# Patient Record
Sex: Male | Born: 1980 | Race: White | Hispanic: No | Marital: Single | Smoking: Never smoker
Health system: Southern US, Community
[De-identification: ages and names within clinical notes are randomized; demographics above are authoritative.]

## PROBLEM LIST (undated history)

## (undated) DIAGNOSIS — M109 Gout, unspecified: Secondary | ICD-10-CM

## (undated) DIAGNOSIS — E119 Type 2 diabetes mellitus without complications: Secondary | ICD-10-CM

## (undated) DIAGNOSIS — M1990 Unspecified osteoarthritis, unspecified site: Secondary | ICD-10-CM

## (undated) DIAGNOSIS — I10 Essential (primary) hypertension: Secondary | ICD-10-CM

## (undated) HISTORY — DX: Type 2 diabetes mellitus without complications: E11.9

## (undated) HISTORY — PX: EAR TUBE REMOVAL: SHX1486

## (undated) HISTORY — DX: Gout, unspecified: M10.9

## (undated) HISTORY — PX: WISDOM TOOTH EXTRACTION: SHX21

---

## 2005-07-08 ENCOUNTER — Emergency Department (HOSPITAL_COMMUNITY): Admission: EM | Admit: 2005-07-08 | Discharge: 2005-07-08 | Payer: Self-pay | Admitting: Emergency Medicine

## 2005-07-15 ENCOUNTER — Emergency Department (HOSPITAL_COMMUNITY): Admission: EM | Admit: 2005-07-15 | Discharge: 2005-07-15 | Payer: Self-pay | Admitting: Emergency Medicine

## 2016-04-08 DIAGNOSIS — Z87442 Personal history of urinary calculi: Secondary | ICD-10-CM

## 2016-04-08 HISTORY — DX: Personal history of urinary calculi: Z87.442

## 2016-11-25 ENCOUNTER — Ambulatory Visit (INDEPENDENT_AMBULATORY_CARE_PROVIDER_SITE_OTHER)

## 2016-11-25 ENCOUNTER — Encounter: Payer: Self-pay | Admitting: Family Medicine

## 2016-11-25 ENCOUNTER — Ambulatory Visit (INDEPENDENT_AMBULATORY_CARE_PROVIDER_SITE_OTHER): Admitting: Family Medicine

## 2016-11-25 VITALS — BP 142/82 | HR 64 | Temp 98.7°F | Resp 16 | Ht 69.0 in | Wt 300.0 lb

## 2016-11-25 DIAGNOSIS — M25572 Pain in left ankle and joints of left foot: Secondary | ICD-10-CM | POA: Diagnosis not present

## 2016-11-25 DIAGNOSIS — IMO0001 Reserved for inherently not codable concepts without codable children: Secondary | ICD-10-CM

## 2016-11-25 DIAGNOSIS — S93409A Sprain of unspecified ligament of unspecified ankle, initial encounter: Secondary | ICD-10-CM

## 2016-11-25 NOTE — Progress Notes (Addendum)
Subjective:  By signing my name below, I, Wayne Castillo, attest that this documentation has been prepared under the direction and in the presence of Shade Flood, MD Electronically Signed: Charline Bills, ED Scribe 11/25/2016 at 5:35 PM.   Patient ID: Wayne Castillo, male    DOB: [birth year], 36 y.o.   MRN: [id number]  Chief Complaint  Patient presents with  . Ankle Pain    twisted left ankle today when getting off truck   HPI JUANANTONIO STOLAR is a 36 y.o. male who presents to Primary Care at [facility] complaining of an injury that occurred at work today. 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 Pt reports feeling a pop in his left ankle during the incident just prior to noticing swelling. States that he may have twisted both ankles previously but no difficulty using his ankle prior to injury. Pt was able to ambulate after the injury but states pain set in 10 minutes following the injury. No treatments tried PTA.   No Known Allergies  No current outpatient prescriptions on file prior to visit.   No current facility-administered medications on file prior to visit.    Review of Systems  Musculoskeletal: Positive for arthralgias and joint swelling.      Objective:   Physical Exam  Constitutional: He is oriented to person, place, and time. He appears well-developed and well-nourished. No distress.  HENT:  Head: Normocephalic and atraumatic.  Eyes: Conjunctivae and EOM are normal.  Neck: Neck supple. No tracheal deviation present.  Cardiovascular: Normal rate.   Pulses:      Dorsalis pedis pulses are 2+ on the right side, and 2+ on the left side.  Pulmonary/Chest: Effort normal. No respiratory distress.  Musculoskeletal: Normal range of motion.  L knee: pain free ROM. No fibular head tenderness.  L ankle: medial malleolus non-tender posteriorly. Soft tissue swelling over lateral  ankle. Some tenderness over lateral malleolus. Navicular non-tender. Some guarding with inversion and eversion. Cap refill less than 2 seconds of all toes. NVI distally. Achilles is non-tender.  Neurological: He is alert and oriented to person, place, and time.  Skin: Skin is warm and dry.  Psychiatric: He has a normal mood and affect. His behavior is normal.  Nursing note and vitals reviewed.  Vitals:   11/25/16 1609 11/25/16 1649  BP: (!) 145/83 (!) 142/82  Pulse: 64   Resp: 16   Temp: 98.7 F (37.1 C)   TempSrc: Oral   SpO2: 96%   Weight: 300 lb (136.1 kg)   Height: 5\' 9"  (1.753 m)    Dg Ankle Complete Left  Result Date: 11/25/2016 CLINICAL DATA:  Left ankle pain/swelling EXAM: LEFT ANKLE COMPLETE - 3+ VIEW COMPARISON:  None. FINDINGS: No fracture or dislocation is seen. The ankle mortise is intact. The base of the fifth metatarsal is unremarkable. Mild lateral soft tissue swelling. IMPRESSION: No fracture or dislocation is seen. Mild lateral soft tissue swelling. Electronically Signed   By: Charline Bills M.D.   On: 11/25/2016 18:12        Assessment & Plan:   Wayne Castillo is a 36 y.o. male Acute left ankle pain - Plan: DG Ankle Complete Left, Apply ASO ankle First degree ankle sprain, unspecified laterality, initial encounter - Plan: Apply ASO ankle  - Left lateral ankle sprain, no sign of fracture on x-ray.  Minimal laxity with testing, suspect grade 1 or grade 2 at the most.  - ASO brace applied, handout given on ankle sprain, ankle alphabet and range of motion discussed, ibuprofen over-the-counter 600 mg every 6 hours as needed, and recheck in 1 week. Restrictions were provided for work including on his duty status report.   - Had difficulty with weightbearing with just ASO brace, placed in crutches, then weight-bear as tolerated. Paperwork addended.   Meds ordered this encounter  Medications  . allopurinol (ZYLOPRIM) 300 MG tablet    Sig: Take 300 mg by mouth daily.    . metFORMIN (GLUCOPHAGE) 500 MG tablet    Sig: Take 500 mg by mouth 2 (two) times daily with a meal.   Patient Instructions   Ibuprofen up to 600mg  every 6 hours as needed with food. Ice and elevate as discussed, range of motion few times per day or ankle alphabet as tolerated, and recheck in 1 week.   Return to the clinic or go to the nearest emergency room if any of your symptoms worsen or new symptoms occur.    Ankle Sprain An ankle sprain is a stretch or tear in one of the tough, fiber-like tissues (ligaments) in the ankle. The ligaments in your ankle help to hold the bones of the ankle together. What are the causes? This condition is often caused by stepping on or falling on the outer edge of the foot. What increases the risk? This condition is more likely to develop in people who play sports. What are the signs or symptoms? Symptoms of this condition include:  Pain in your ankle.  Swelling.  Bruising. Bruising may develop right after you sprain your ankle or 1-2 days later.  Trouble standing or walking, especially when you turn or change directions.  How is this diagnosed? This condition is diagnosed with a physical exam. During the exam, your health care provider will press on certain parts of your foot and ankle and try to move them in certain ways. X-rays may be taken to see how severe the sprain is and to check for broken bones. How is this treated? This condition may be treated with:  A brace. This is used to keep the ankle from moving until it heals.  An elastic bandage. This is used to support the ankle.  Crutches.  Pain medicine.  Surgery. This may be needed if the sprain is severe.  Physical therapy. This may help to improve the range of motion in the ankle.  Follow these instructions at home:  Rest your ankle.  Take over-the-counter and prescription medicines only as told by your health care provider.  For 2-3 days, keep your ankle raised  (elevated) above the level of your heart as much as possible.  If directed, apply ice to the area: ? Put ice in a plastic bag. ? Place a towel between your skin and the bag. ? Leave the ice on for 20 minutes, 2-3 times a day.  If you were given a brace: ? Wear it as directed. ? Remove it to shower or bathe. ? Try not to move your ankle much, but wiggle your toes from time to time. This helps to prevent swelling.  If you were given an elastic bandage (dressing): ? Remove it to shower or bathe. ? Try not to move your ankle much, but wiggle your toes from time to time. This helps to prevent swelling. ? Adjust the dressing to make it more comfortable if it feels too  tight. ? Loosen the dressing if you have numbness or tingling in your foot, or if your foot becomes cold and blue.  If you have crutches, use them as told by your health care provider. Continue to use them until you can walk without feeling pain in your ankle. Contact a health care provider if:  You have rapidly increasing bruising or swelling.  Your pain is not relieved with medicine. Get help right away if:  Your toes or foot becomes numb or blue.  You have severe pain that gets worse. This information is not intended to replace advice given to you by your health care provider. Make sure you discuss any questions you have with your health care provider. Document Released: 03/25/2005 Document Revised: 08/02/2015 Document Reviewed: 10/25/2014 Elsevier Interactive Patient Education  2017 [employer].    IF you received an x-ray today, you will receive an invoice from [facility]iology. Please contact [facility] at [phone] with questions or concerns regarding your invoice.   IF you received labwork today, you will receive an invoice from [place]. Please contact [facility] at [phone] with questions or concerns regarding your invoice.   Our billing staff will not be able to assist you with questions  regarding bills from these companies.  You will be contacted with the lab results as soon as they are available. The fastest way to get your results is to activate your My Chart account. Instructions are located on the last page of this paperwork. If you have not heard from [place] regarding the results in 2 weeks, please contact this office.       I personally performed the services described in this documentation, which was scribed in my presence. The recorded information has been reviewed and considered for accuracy and completeness, addended by me as needed, and agree with information above.  Signed,   Meredith Staggers, MD Primary Care at [facility].  11/25/16 6:35 PM

## 2016-11-25 NOTE — Patient Instructions (Addendum)
Ibuprofen up to 600mg  every 6 hours as needed with food. Ice and elevate as discussed, range of motion few times per day or ankle alphabet as tolerated, and recheck in 1 week.   Crutches tonight, then weight-bear as tolerated. If you're unable to walk with that brace inside of a shoe, will need to return to work with seated work only and use of crutches.  Return to the clinic or go to the nearest emergency room if any of your symptoms worsen or new symptoms occur.    Ankle Sprain An ankle sprain is a stretch or tear in one of the tough, fiber-like tissues (ligaments) in the ankle. The ligaments in your ankle help to hold the bones of the ankle together. What are the causes? This condition is often caused by stepping on or falling on the outer edge of the foot. What increases the risk? This condition is more likely to develop in people who play sports. What are the signs or symptoms? Symptoms of this condition include:  Pain in your ankle.  Swelling.  Bruising. Bruising may develop right after you sprain your ankle or 1-2 days later.  Trouble standing or walking, especially when you turn or change directions.  How is this diagnosed? This condition is diagnosed with a physical exam. During the exam, your health care provider will press on certain parts of your foot and ankle and try to move them in certain ways. X-rays may be taken to see how severe the sprain is and to check for broken bones. How is this treated? This condition may be treated with:  A brace. This is used to keep the ankle from moving until it heals.  An elastic bandage. This is used to support the ankle.  Crutches.  Pain medicine.  Surgery. This may be needed if the sprain is severe.  Physical therapy. This may help to improve the range of motion in the ankle.  Follow these instructions at home:  Rest your ankle.  Take over-the-counter and prescription medicines only as told by your health care  provider.  For 2-3 days, keep your ankle raised (elevated) above the level of your heart as much as possible.  If directed, apply ice to the area: ? Put ice in a plastic bag. ? Place a towel between your skin and the bag. ? Leave the ice on for 20 minutes, 2-3 times a day.  If you were given a brace: ? Wear it as directed. ? Remove it to shower or bathe. ? Try not to move your ankle much, but wiggle your toes from time to time. This helps to prevent swelling.  If you were given an elastic bandage (dressing): ? Remove it to shower or bathe. ? Try not to move your ankle much, but wiggle your toes from time to time. This helps to prevent swelling. ? Adjust the dressing to make it more comfortable if it feels too tight. ? Loosen the dressing if you have numbness or tingling in your foot, or if your foot becomes cold and blue.  If you have crutches, use them as told by your health care provider. Continue to use them until you can walk without feeling pain in your ankle. Contact a health care provider if:  You have rapidly increasing bruising or swelling.  Your pain is not relieved with medicine. Get help right away if:  Your toes or foot becomes numb or blue.  You have severe pain that gets worse. This information is not  intended to replace advice given to you by your health care provider. Make sure you discuss any questions you have with your health care provider. Document Released: 03/25/2005 Document Revised: 08/02/2015 Document Reviewed: 10/25/2014 Elsevier Interactive Patient Education  2017 [employer].    IF you received an x-ray today, you will receive an invoice from [facility]. Please contact [facility] Radiology at [phone] with questions or concerns regarding your invoice.   IF you received labwork today, you will receive an invoice from [place]. Please contact [facility] at [phone] with questions or concerns regarding your invoice.   Our billing  staff will not be able to assist you with questions regarding bills from these companies.  You will be contacted with the lab results as soon as they are available. The fastest way to get your results is to activate your My Chart account. Instructions are located on the last page of this paperwork. If you have not heard from [place] regarding the results in 2 weeks, please contact this office.

## 2016-11-29 DIAGNOSIS — E119 Type 2 diabetes mellitus without complications: Secondary | ICD-10-CM | POA: Diagnosis not present

## 2016-11-29 DIAGNOSIS — M1A09X0 Idiopathic chronic gout, multiple sites, without tophus (tophi): Secondary | ICD-10-CM | POA: Diagnosis not present

## 2016-11-29 DIAGNOSIS — R030 Elevated blood-pressure reading, without diagnosis of hypertension: Secondary | ICD-10-CM | POA: Diagnosis not present

## 2017-06-17 ENCOUNTER — Encounter: Payer: Self-pay | Admitting: Family Medicine

## 2017-06-17 ENCOUNTER — Ambulatory Visit: Payer: Federal, State, Local not specified - PPO | Admitting: Family Medicine

## 2017-06-17 VITALS — BP 120/68 | HR 83 | Ht 69.0 in | Wt 314.1 lb

## 2017-06-17 DIAGNOSIS — E669 Obesity, unspecified: Secondary | ICD-10-CM | POA: Insufficient documentation

## 2017-06-17 DIAGNOSIS — S29019A Strain of muscle and tendon of unspecified wall of thorax, initial encounter: Secondary | ICD-10-CM | POA: Diagnosis not present

## 2017-06-17 DIAGNOSIS — Z6841 Body Mass Index (BMI) 40.0 and over, adult: Secondary | ICD-10-CM | POA: Diagnosis not present

## 2017-06-17 MED ORDER — METHOCARBAMOL 500 MG PO TABS: 500.0000 mg | ORAL_TABLET | Freq: Four times a day (QID) | ORAL | 1 refills | Status: DC

## 2017-06-17 NOTE — Addendum Note (Signed)
Addended by: Nadene RubinsKREMER, WILLIAM A on: 06/17/2017 11:45 AM   Modules accepted: Orders

## 2017-06-17 NOTE — Patient Instructions (Addendum)
BMI for Adults Body mass index (BMI) is a number that is calculated from a person's weight and height. In most adults, the number is used to find how much of an adult's weight is made up of fat. BMI is not as accurate as a direct measure of body fat. How is BMI calculated? BMI is calculated by dividing weight in kilograms by height in meters squared. It can also be calculated by dividing weight in pounds by height in inches squared, then multiplying the resulting number by 703. Charts are available to help you find your BMI quickly and easily without doing this calculation. How is BMI interpreted? Health care professionals use BMI charts to identify whether an adult is underweight, at a normal weight, or overweight based on the following guidelines:  Underweight: BMI less than 18.5.  Normal weight: BMI between 18.5 and 24.9.  Overweight: BMI between 25 and 29.9.  Obese: BMI of 30 and above.  BMI is usually interpreted the same for males and females. Weight includes both fat and muscle, so someone with a muscular build, such as an athlete, may have a BMI that is higher than 24.9. In cases like these, BMI may not accurately depict body fat. To determine if excess body fat is the cause of a BMI of 25 or higher, further assessments may need to be done by a health care provider. Why is BMI a useful tool? BMI is used to identify a possible weight problem that may be related to a medical problem or may increase the risk for medical problems. BMI can also be used to promote changes to reach a healthy weight. This information is not intended to replace advice given to you by your health care provider. Make sure you discuss any questions you have with your health care provider. Document Released: 12/05/2003 Document Revised: 08/03/2015 Document Reviewed: 08/20/2013 Elsevier Interactive Patient Education  2018 [employer].  Exercising to [employer] Exercising can help you to lose weight. In order to  lose weight through exercise, you need to do vigorous-intensity exercise. You can tell that you are exercising with vigorous intensity if you are breathing very hard and fast and cannot hold a conversation while exercising. Moderate-intensity exercise helps to maintain your current weight. You can tell that you are exercising at a moderate level if you have a higher heart rate and faster breathing, but you are still able to hold a conversation. How often should I exercise? Choose an activity that you enjoy and set realistic goals. Your health care provider can help you to make an activity plan that works for you. Exercise regularly as directed by your health care provider. This may include:  Doing resistance training twice each week, such as: ? Push-ups. ? Sit-ups. ? Lifting weights. ? Using resistance bands.  Doing a given intensity of exercise for a given amount of time. Choose from these options: ? 150 minutes of moderate-intensity exercise every week. ? 75 minutes of vigorous-intensity exercise every week. ? A mix of moderate-intensity and vigorous-intensity exercise every week.  Children, pregnant women, people who are out of shape, people who are overweight, and older adults may need to consult a health care provider for individual recommendations. If you have any sort of medical condition, be sure to consult your health care provider before starting a new exercise program. What are some activities that can help me to lose weight?  Walking at a rate of at least 4.5 miles an hour.  Jogging or running at  a rate of 5 miles per hour.  Biking at a rate of at least 10 miles per hour.  Lap swimming.  Roller-skating or in-line skating.  Cross-country skiing.  Vigorous competitive sports, such as football, basketball, and soccer.  Jumping rope.  Aerobic dancing. How can I be more active in my day-to-day activities?  Use the stairs instead of the elevator.  Take a walk during your  lunch break.  If you drive, park your car farther away from work or school.  If you take public transportation, get off one stop early and walk the rest of the way.  Make all of your phone calls while standing up and walking around.  Get up, stretch, and walk around every 30 minutes throughout the day. What guidelines should I follow while exercising?  Do not exercise so much that you hurt yourself, feel dizzy, or get very short of breath.  Consult your health care provider prior to starting a new exercise program.  Wear comfortable clothes and shoes with good support.  Drink plenty of water while you exercise to prevent dehydration or heat stroke. Body water is lost during exercise and must be replaced.  Work out until you breathe faster and your heart beats faster. This information is not intended to replace advice given to you by your health care provider. Make sure you discuss any questions you have with your health care provider. Document Released: 04/27/2010 Document Revised: 08/31/2015 Document Reviewed: 08/26/2013 Elsevier Interactive Patient Education  2018 [employer].  Muscle Strain A muscle strain is an injury that occurs when a muscle is stretched beyond its normal length. Usually a small number of muscle fibers are torn when this happens. Muscle strain is rated in degrees. First-degree strains have the least amount of muscle fiber tearing and pain. Second-degree and third-degree strains have increasingly more tearing and pain. Usually, recovery from muscle strain takes 1-2 weeks. Complete healing takes 5-6 weeks. What are the causes? Muscle strain happens when a sudden, violent force placed on a muscle stretches it too far. This may occur with lifting, sports, or a fall. What increases the risk? Muscle strain is especially common in athletes. What are the signs or symptoms? At the site of the muscle strain, there may be:  Pain.  Bruising.  Swelling.  Difficulty  using the muscle due to pain or lack of normal function.  How is this diagnosed? Your health care provider will perform a physical exam and ask about your medical history. How is this treated? Often, the best treatment for a muscle strain is resting, icing, and applying cold compresses to the injured area. Follow these instructions at home:  Use the PRICE method of treatment to promote muscle healing during the first 2-3 days after your injury. The PRICE method involves: ? Protecting the muscle from being injured again. ? Restricting your activity and resting the injured body part. ? Icing your injury. To do this, put ice in a plastic bag. Place a towel between your skin and the bag. Then, apply the ice and leave it on from 15-20 minutes each hour. After the third day, switch to moist heat packs. ? Apply compression to the injured area with a splint or elastic bandage. Be careful not to wrap it too tightly. This may interfere with blood circulation or increase swelling. ? Elevate the injured body part above the level of your heart as often as you can.  Only take over-the-counter or prescription medicines for pain, discomfort, or fever  as directed by your health care provider.  Warming up prior to exercise helps to prevent future muscle strains. Contact a health care provider if:  You have increasing pain or swelling in the injured area.  You have numbness, tingling, or a significant loss of strength in the injured area. This information is not intended to replace advice given to you by your health care provider. Make sure you discuss any questions you have with your health care provider. Document Released: 03/25/2005 Document Revised: 08/31/2015 Document Reviewed: 10/22/2012 Elsevier Interactive Patient Education  2017 [employer] Inc.  Obesity, Adult Obesity is the condition of having too much total body fat. Being overweight or obese means that your weight is greater than what is  considered healthy for your body size. Obesity is determined by a measurement called BMI. BMI is an estimate of body fat and is calculated from height and weight. For adults, a BMI of 30 or higher is considered obese. Obesity can eventually lead to other health concerns and major illnesses, including:  Stroke.  Coronary artery disease (CAD).  Type 2 diabetes.  Some types of cancer, including cancers of the colon, breast, uterus, and gallbladder.  Osteoarthritis.  High blood pressure (hypertension).  High cholesterol.  Sleep apnea.  Gallbladder stones.  Infertility problems.  What are the causes? The main cause of obesity is taking in (consuming) more calories than your body uses for energy. Other factors that contribute to this condition may include:  Being born with genes that make you more likely to become obese.  Having a medical condition that causes obesity. These conditions include: ? Hypothyroidism. ? Polycystic ovarian syndrome (PCOS). ? Binge-eating disorder. ? Cushing syndrome.  Taking certain medicines, such as steroids, antidepressants, and seizure medicines.  Not being physically active (sedentary lifestyle).  Living where there are limited places to exercise safely or buy healthy foods.  Not getting enough sleep.  What increases the risk? The following factors may increase your risk of this condition:  Having a family history of obesity.  Being a woman of African-American descent.  Being a man of Hispanic descent.  What are the signs or symptoms? Having excessive body fat is the main symptom of this condition. How is this diagnosed? This condition may be diagnosed based on:  Your symptoms.  Your medical history.  A physical exam. Your health care provider may measure: ? Your BMI. If you are an adult with a BMI between 25 and less than 30, you are considered overweight. If you are an adult with a BMI of 30 or higher, you are considered  obese. ? The distances around your hips and your waist (circumferences). These may be compared to each other to help diagnose your condition. ? Your skinfold thickness. Your health care provider may gently pinch a fold of your skin and measure it.  How is this treated? Treatment for this condition often includes changing your lifestyle. Treatment may include some or all of the following:  Dietary changes. Work with your health care provider and a dietitian to set a weight-loss goal that is healthy and reasonable for you. Dietary changes may include eating: ? Smaller portions. A portion size is the amount of a particular food that is healthy for you to eat at one time. This varies from person to person. ? Low-calorie or low-fat options. ? More whole grains, fruits, and vegetables.  Regular physical activity. This may include aerobic activity (cardio) and strength training.  Medicine to help you lose weight. Your  health care provider may prescribe medicine if you are unable to lose 1 pound a week after 6 weeks of eating more healthily and doing more physical activity.  Surgery. Surgical options may include gastric banding and gastric bypass. Surgery may be done if: ? Other treatments have not helped to improve your condition. ? You have a BMI of 40 or higher. ? You have life-threatening health problems related to obesity.  Follow these instructions at home:  Eating and drinking   Follow recommendations from your health care provider about what you eat and drink. Your health care provider may advise you to: ? Limit fast foods, sweets, and processed snack foods. ? Choose low-fat options, such as low-fat milk instead of whole milk. ? Eat 5 or more servings of fruits or vegetables every day. ? Eat at home more often. This gives you more control over what you eat. ? Choose healthy foods when you eat out. ? Learn what a healthy portion size is. ? Keep low-fat snacks on hand. ? Avoid sugary  drinks, such as soda, fruit juice, iced tea sweetened with sugar, and flavored milk. ? Eat a healthy breakfast.  Drink enough water to keep your urine clear or pale yellow.  Do not go without eating for long periods of time (do not fast) or follow a fad diet. Fasting and fad diets can be unhealthy and even dangerous. Physical Activity  Exercise regularly, as told by your health care provider. Ask your health care provider what types of exercise are safe for you and how often you should exercise.  Warm up and stretch before being active.  Cool down and stretch after being active.  Rest between periods of activity. Lifestyle  Limit the time that you spend in front of your TV, computer, or video game system.  Find ways to reward yourself that do not involve food.  Limit alcohol intake to no more than 1 drink a day for nonpregnant women and 2 drinks a day for men. One drink equals 12 oz of beer, 5 oz of wine, or 1 oz of hard liquor. General instructions  Keep a weight loss journal to keep track of the food you eat and how much you exercise you get.  Take over-the-counter and prescription medicines only as told by your health care provider.  Take vitamins and supplements only as told by your health care provider.  Consider joining a support group. Your health care provider may be able to recommend a support group.  Keep all follow-up visits as told by your health care provider. This is important. Contact a health care provider if:  You are unable to meet your weight loss goal after 6 weeks of dietary and lifestyle changes. This information is not intended to replace advice given to you by your health care provider. Make sure you discuss any questions you have with your health care provider. Document Released: 05/02/2004 Document Revised: 08/28/2015 Document Reviewed: 01/11/2015 Elsevier Interactive Patient Education  2018 [employer].

## 2017-06-17 NOTE — Progress Notes (Addendum)
Subjective:  Patient ID: Wayne Castillo, male    DOB: [birth year]  Age: 37 y.o. MRN: [id number]  CC: back spasms   HPI CASHIS RILL presents for evaluation of a 2-day history of muscle spasms in the middle part of his back.  There has been no injury to speak of.  He has a long-standing history of intermittent back pain associated with muscle spasms.  He works for the [employer] and certainly does some bending and stooping on that job.  He has taken muscle relaxers and over-the-counter ibuprofen for this in the past.  Relief.  He has not gone for physical therapy yet.  He is well-known to me.  He has struggled with obesity number since known him.  He has a past medical history to include diabetes, gout and of course obesity.  He has had some nutrition counseling for his diabetes and obesity.  He is currently going to the gym.  He tells me that he gets 10,000 steps a day with his job and going to the gym.  Outpatient Medications Prior to Visit  Medication Sig Dispense Refill  . allopurinol (ZYLOPRIM) 300 MG tablet Take 300 mg by mouth daily.    . metFORMIN (GLUCOPHAGE) 500 MG tablet Take 500 mg by mouth 2 (two) times daily with a meal.    . olmesartan (BENICAR) 5 MG tablet Take 1 tablet by mouth daily.  3   No facility-administered medications prior to visit.     ROS Review of Systems  Constitutional: Negative.   HENT: Negative.   Eyes: Negative.   Respiratory: Negative.   Cardiovascular: Negative.   Gastrointestinal: Negative.   Endocrine: Negative for polyphagia and polyuria.  Genitourinary: Negative.   Musculoskeletal: Positive for back pain and myalgias. Negative for gait problem and joint swelling.  Skin: Negative.   Allergic/Immunologic: Negative for immunocompromised state.  Neurological: Negative.   Hematological: Does not bruise/bleed easily.  Psychiatric/Behavioral: Negative.     Objective:  BP 120/68 (BP Location: Left Arm, Patient Position: Sitting, Cuff Size: Large)    Pulse 83   Ht 5\' 9"  (1.753 m)   Wt (!) 314 lb 2 oz (142.5 kg)   SpO2 97%   BMI 46.39 kg/m   BP Readings from Last 3 Encounters:  06/17/17 120/68  11/25/16 (!) 142/82    Wt Readings from Last 3 Encounters:  06/17/17 (!) 314 lb 2 oz (142.5 kg)  11/25/16 300 lb (136.1 kg)    Physical Exam  Constitutional: He is oriented to person, place, and time. He appears well-developed and well-nourished. No distress.  HENT:  Head: Normocephalic and atraumatic.  Right Ear: External ear normal.  Left Ear: External ear normal.  Eyes: Conjunctivae are normal. Right eye exhibits no discharge. Left eye exhibits no discharge. No scleral icterus.  Neck: No JVD present. No tracheal deviation present.  Pulmonary/Chest: Effort normal. No stridor.  Musculoskeletal:       Thoracic back: He exhibits tenderness. He exhibits normal range of motion, no bony tenderness and no spasm.       Lumbar back: He exhibits normal range of motion, no tenderness, no bony tenderness and no spasm.  Neurological: He is alert and oriented to person, place, and time. He has normal strength.  Reflex Scores:      Patellar reflexes are 1+ on the right side and 1+ on the left side.      Achilles reflexes are 1+ on the right side and 1+ on the left side. Neg  dural tension signs.    Skin: Skin is warm and dry. He is not diaphoretic. No erythema. No pallor.  Psychiatric: He has a normal mood and affect. His behavior is normal.    No results found for: WBC, HGB, HCT, PLT, GLUCOSE, CHOL, TRIG, HDL, LDLDIRECT, LDLCALC, ALT, AST, NA, K, CL, CREATININE, BUN, CO2, TSH, PSA, INR, GLUF, HGBA1C, MICROALBUR  No results found.  Assessment & Plan:   Hessie Dienerlan was seen today for back spasms.  Diagnoses and all orders for this visit:  Thoracic myofascial strain, initial encounter -     methocarbamol (ROBAXIN) 500 MG tablet; Take 1 tablet (500 mg total) by mouth 4 (four) times daily. As needed  Class 3 severe obesity due to excess  calories with body mass index (BMI) of 45.0 to 49.9 in adult, unspecified whether serious comorbidity present (HCC) -     Amb Ref to Medical Weight Management -     Ambulatory referral to General Surgery   I am having Tandy GawAlan G. Iovino start on methocarbamol. I am also having him maintain his allopurinol, metFORMIN, and olmesartan.  Meds ordered this encounter  Medications  . methocarbamol (ROBAXIN) 500 MG tablet    Sig: Take 1 tablet (500 mg total) by mouth 4 (four) times daily. As needed    Dispense:  60 tablet    Refill:  1   Patient will use Robaxin and ibuprofen for relief.  He was given information on spasms.  May consider sports medicine referral or further intensive therapy for this ongoing issues with him.  Issues are related to his weight and we discussed that.  He will follow-up with me I am referring him to the bariatric surgeon for consultation.  At his convenience or treatment and evaluation of his diabetes gout.  Follow-up: Return in about 3 months (around 09/17/2017), or if symptoms worsen or fail to improve.  Mliss SaxWilliam Alfred Kremer, MD

## 2017-06-20 ENCOUNTER — Encounter: Payer: Self-pay | Admitting: Family Medicine

## 2017-06-24 ENCOUNTER — Other Ambulatory Visit: Payer: Self-pay | Admitting: Family Medicine

## 2017-07-08 ENCOUNTER — Ambulatory Visit (INDEPENDENT_AMBULATORY_CARE_PROVIDER_SITE_OTHER): Payer: Federal, State, Local not specified - PPO | Admitting: Family Medicine

## 2017-07-08 ENCOUNTER — Encounter: Payer: Self-pay | Admitting: Family Medicine

## 2017-07-08 VITALS — BP 124/74 | HR 99 | Ht 69.0 in | Wt 313.4 lb

## 2017-07-08 DIAGNOSIS — Z6841 Body Mass Index (BMI) 40.0 and over, adult: Secondary | ICD-10-CM

## 2017-07-08 DIAGNOSIS — E119 Type 2 diabetes mellitus without complications: Secondary | ICD-10-CM

## 2017-07-08 DIAGNOSIS — Z0001 Encounter for general adult medical examination with abnormal findings: Secondary | ICD-10-CM

## 2017-07-08 DIAGNOSIS — M109 Gout, unspecified: Secondary | ICD-10-CM

## 2017-07-08 LAB — COMPREHENSIVE METABOLIC PANEL
ALBUMIN: 4.2 g/dL (ref 3.5–5.2)
ALT: 34 U/L (ref 0–53)
AST: 28 U/L (ref 0–37)
Alkaline Phosphatase: 75 U/L (ref 39–117)
BILIRUBIN TOTAL: 0.6 mg/dL (ref 0.2–1.2)
BUN: 15 mg/dL (ref 6–23)
CALCIUM: 9.3 mg/dL (ref 8.4–10.5)
CO2: 29 mEq/L (ref 19–32)
CREATININE: 0.79 mg/dL (ref 0.40–1.50)
Chloride: 102 mEq/L (ref 96–112)
GFR: 116.96 mL/min (ref >60.00)
Glucose, Bld: 119 mg/dL — ABNORMAL HIGH (ref 70–99)
Potassium: 4.7 mEq/L (ref 3.5–5.1)
Sodium: 138 mEq/L (ref 135–145)
TOTAL PROTEIN: 7.4 g/dL (ref 6.0–8.3)

## 2017-07-08 LAB — CBC
HCT: 43.8 % (ref 39.0–52.0)
Hemoglobin: 14.6 g/dL (ref 13.0–17.0)
MCHC: 33.4 g/dL (ref 30.0–36.0)
MCV: 89.1 fl (ref 78.0–100.0)
PLATELETS: 244 10*3/uL (ref 150.0–400.0)
RBC: 4.92 Mil/uL (ref 4.22–5.81)
RDW: 13.5 % (ref 11.5–15.5)
WBC: 6.9 10*3/uL (ref 4.0–10.5)

## 2017-07-08 LAB — URINALYSIS, ROUTINE W REFLEX MICROSCOPIC
BILIRUBIN URINE: NEGATIVE
Hgb urine dipstick: NEGATIVE
KETONES UR: NEGATIVE
Leukocytes, UA: NEGATIVE
Nitrite: NEGATIVE
PH: 7 (ref 5.0–8.0)
RBC / HPF: NONE SEEN (ref 0–?)
Specific Gravity, Urine: 1.02 (ref 1.000–1.030)
Total Protein, Urine: NEGATIVE
UROBILINOGEN UA: 0.2 (ref 0.0–1.0)
Urine Glucose: NEGATIVE

## 2017-07-08 LAB — LIPID PANEL
CHOLESTEROL: 151 mg/dL (ref 0–200)
HDL: 44.3 mg/dL (ref >39.00)
LDL Cholesterol: 85 mg/dL (ref 0–99)
NonHDL: 106.28
TRIGLYCERIDES: 107 mg/dL (ref 0.0–149.0)
Total CHOL/HDL Ratio: 3
VLDL: 21.4 mg/dL (ref 0.0–40.0)

## 2017-07-08 LAB — MICROALBUMIN / CREATININE URINE RATIO
Creatinine,U: 96.1 mg/dL
MICROALB UR: 0.9 mg/dL (ref 0.0–1.9)
Microalb Creat Ratio: 1 mg/g (ref 0.0–30.0)

## 2017-07-08 LAB — HEMOGLOBIN A1C: Hgb A1c MFr Bld: 6.3 % (ref 4.6–6.5)

## 2017-07-08 LAB — URIC ACID: Uric Acid, Serum: 5.6 mg/dL (ref 4.0–7.8)

## 2017-07-08 MED ORDER — OLMESARTAN MEDOXOMIL 5 MG PO TABS: 5.0000 mg | ORAL_TABLET | Freq: Every day | ORAL | 1 refills | Status: DC

## 2017-07-08 MED ORDER — ALLOPURINOL 300 MG PO TABS: 300.0000 mg | ORAL_TABLET | Freq: Every day | ORAL | 1 refills | Status: DC

## 2017-07-08 MED ORDER — METFORMIN HCL 500 MG PO TABS: 500.0000 mg | ORAL_TABLET | Freq: Two times a day (BID) | ORAL | 1 refills | Status: DC

## 2017-07-08 NOTE — Progress Notes (Signed)
Subjective:  Patient ID: Wayne Castillo, male    DOB: 1980-11-17  Age: 37 y.o. MRN: [id number]  CC: Annual Exam   HPI Wayne Castillo presents for a physical exam and follow-up of his obesity diabetes and gout.  He is in the early stages of becoming qualified for possible bariatric surgery and plans to continue with that.  There was confusion about his Glucophage prescription and he is only been taking it once a day.  He is taking his allopurinol daily and has had no gouty flares.  He is working out in the gym every morning by walking and lifting weights.  He is able to achieve 13,000 steps daily on his regular mail out.  He decided not to go for nutrition counseling.  We had a discussion about the fact that that would be important if he were to proceed with some type of bariatric surgery.  He admits to being angry with himself with regards to his weight.  He admits to being somewhat sad and lonely.  He denies frank depression or any thoughts of self-harm.  He has been well this winter and has not suffered from springtime allergies  Outpatient Medications Prior to Visit  Medication Sig Dispense Refill  . methocarbamol (ROBAXIN) 500 MG tablet Take 1 tablet (500 mg total) by mouth 4 (four) times daily. As needed 60 tablet 1  . allopurinol (ZYLOPRIM) 300 MG tablet TAKE 1 TABLET BY MOUTH DAILY 90 tablet 0  . metFORMIN (GLUCOPHAGE) 500 MG tablet Take 500 mg by mouth 2 (two) times daily with a meal.    . olmesartan (BENICAR) 5 MG tablet Take 1 tablet by mouth daily.  3   No facility-administered medications prior to visit.     ROS Review of Systems  Constitutional: Negative.  Negative for unexpected weight change.  HENT: Negative.   Eyes: Negative for photophobia and visual disturbance.  Respiratory: Negative.   Cardiovascular: Negative.   Gastrointestinal: Negative.   Endocrine: Negative for polyphagia and polyuria.  Genitourinary: Negative for difficulty urinating and hematuria.    Musculoskeletal: Negative for gait problem and myalgias.  Skin: Negative for pallor and rash.  Allergic/Immunologic: Negative for immunocompromised state.  Neurological: Negative for weakness and headaches.  Hematological: Does not bruise/bleed easily.  Psychiatric/Behavioral: Negative for dysphoric mood, self-injury and suicidal ideas. The patient is not nervous/anxious.     Objective:  BP 124/74 (BP Location: Left Arm, Patient Position: Sitting, Cuff Size: Normal)   Pulse 99   Ht 5\' 9"  (1.753 m)   Wt (!) 313 lb 6 oz (142.1 kg)   SpO2 96%   BMI 46.28 kg/m   BP Readings from Last 3 Encounters:  07/08/17 124/74  06/17/17 120/68  11/25/16 (!) 142/82    Wt Readings from Last 3 Encounters:  07/08/17 (!) 313 lb 6 oz (142.1 kg)  06/17/17 (!) 314 lb 2 oz (142.5 kg)  11/25/16 300 lb (136.1 kg)    Physical Exam  Constitutional: He is oriented to person, place, and time. He appears well-developed and well-nourished. No distress.  HENT:  Head: Normocephalic and atraumatic.  Right Ear: External ear normal.  Left Ear: External ear normal.  Mouth/Throat: Oropharynx is clear and moist. No oropharyngeal exudate.  Eyes: Pupils are equal, round, and reactive to light. Conjunctivae are normal. Right eye exhibits no discharge. Left eye exhibits no discharge. No scleral icterus.  Neck: Neck supple. No JVD present. No tracheal deviation present. No thyromegaly present.  Cardiovascular: Normal rate, regular rhythm  and normal heart sounds.  Pulmonary/Chest: Effort normal and breath sounds normal. No stridor.  Abdominal: Soft. Bowel sounds are normal. He exhibits no distension. There is no tenderness. There is no rebound.  Lymphadenopathy:    He has no cervical adenopathy.  Neurological: He is alert and oriented to person, place, and time.  Skin: Skin is warm and dry. He is not diaphoretic.  Psychiatric: He has a normal mood and affect. His behavior is normal.    No results found for: WBC,  HGB, HCT, PLT, GLUCOSE, CHOL, TRIG, HDL, LDLDIRECT, LDLCALC, ALT, AST, NA, K, CL, CREATININE, BUN, CO2, TSH, PSA, INR, GLUF, HGBA1C, MICROALBUR  No results found.  Assessment & Plan:   Wayne Castillo was seen today for annual exam.  Diagnoses and all orders for this visit:  Class 3 severe obesity due to excess calories with body mass index (BMI) of 45.0 to 49.9 in adult, unspecified whether serious comorbidity present (HCC) -     CBC -     Lipid panel  Controlled type 2 diabetes mellitus without complication, without long-term current use of insulin (HCC) -     CBC -     Comprehensive metabolic panel -     Lipid panel -     Urinalysis, Routine w reflex microscopic -     Hemoglobin A1c -     Microalbumin / creatinine urine ratio -     metFORMIN (GLUCOPHAGE) 500 MG tablet; Take 1 tablet (500 mg total) by mouth 2 (two) times daily with a meal. -     olmesartan (BENICAR) 5 MG tablet; Take 1 tablet (5 mg total) by mouth daily.  Gout, unspecified cause, unspecified chronicity, unspecified site -     CBC -     Uric acid -     allopurinol (ZYLOPRIM) 300 MG tablet; Take 1 tablet (300 mg total) by mouth daily.   I have changed Wayne Castillo allopurinol, metFORMIN, and olmesartan. I am also having him maintain his methocarbamol.  Meds ordered this encounter  Medications  . allopurinol (ZYLOPRIM) 300 MG tablet    Sig: Take 1 tablet (300 mg total) by mouth daily.    Dispense:  90 tablet    Refill:  1  . metFORMIN (GLUCOPHAGE) 500 MG tablet    Sig: Take 1 tablet (500 mg total) by mouth 2 (two) times daily with a meal.    Dispense:  180 tablet    Refill:  1  . olmesartan (BENICAR) 5 MG tablet    Sig: Take 1 tablet (5 mg total) by mouth daily.    Dispense:  90 tablet    Refill:  1   Encouraged him to continue his healthy habits and follow-up with bariatric surgery.  We may need to pursue nutrition counseling again at a later date.  Follow-up: Return in about 6 months (around  01/07/2018).  Mliss Sax, MD

## 2017-07-08 NOTE — Patient Instructions (Signed)
Complementary and Alternative Medical Therapies for Diabetes Complementary and alternative medical therapies are treatments that are different from typical medical treatments (Western treatments). "Complementary" means that the therapy is used with Western treatments. "Alternative" means that the therapy is used instead of Western treatments. Are these therapies safe? Some of these therapies are usually safe. Others may be harmful. Often, there is not enough research to show how safe or effective a therapy is. If you want to try a complementary or alternative therapy, talk with your health care provider to make sure it is safe. What alternative or complementary therapies are used to treat diabetes? Acupuncture Acupuncture is the insertion of needles into certain places on the skin. This is done by a professional. It is often used to relieve long-term (chronic) pain, especially of the bones and joints. It may help you if you have painful nerve damage. Biofeedback Biofeedback helps you to become more aware of your body's response to pain. It also helps you to learn ways of dealing with pain. Biofeedback is about relaxing and reducing stress. An example of a biofeedback technique is guided imagery. This involves creating peaceful images in your mind. Chromium Chromium is a substance that can help improve how insulin works in the body. Chromium is in many foods, including whole grains, nuts, and egg yolks. Chromium may also be taken as a supplement. Taking chromium supplements may help to control diabetes, especially if you have a lack of chromium (deficiency) in your body. However, research has not proven this. If you have kidney problems, you should be careful with chromium supplements. American ginseng American ginseng is an herb that may lower glucose levels. It may also help lower A1C levels. More research is needed before recommendations for ginseng use can be made. Magnesium Magnesium is a mineral  found in many foods, such as whole grains, nuts, and green leafy vegetables. Having low magnesium levels may make controlling blood glucose more difficult for people who have type 2 diabetes. Low magnesium levels may also contribute to certain diabetes complications. Getting more magnesium and eating a high-fiber diet may reduce the risk of developing type 2 diabetes. Vanadium Vanadium is a compound found in small amounts in certain plants and animals. Some studies show that it improves glucose levels in animals with diabetes. In one study, people with diabetes were able to lower their insulin dosage when taking vanadium. More research about side effects and safe dosage levels is needed. Cinnamon Cinnamon may decrease insulin resistance, increase insulin production, and lower blood glucose levels. It may work best when used with diabetes medicines. Fenugreek Fenugreek is an herb whose seeds are often used in cooking. It may help lower blood glucose by decreasing carbohydrate absorption and increasing insulin production. Summary  Talk with your health care provider about complementary or alternative therapy for you. Some therapies may be appropriate for you, but others may cause side effects.  Follow your diabetes care plan as prescribed. This information is not intended to replace advice given to you by your health care provider. Make sure you discuss any questions you have with your health care provider. Document Released: 01/20/2007 Document Revised: 04/10/2016 Document Reviewed: 04/10/2016 Elsevier Interactive Patient Education  2017 [employer].  Exercising to [employer] Exercising can help you to lose weight. In order to lose weight through exercise, you need to do vigorous-intensity exercise. You can tell that you are exercising with vigorous intensity if you are breathing very hard and fast and cannot hold a conversation while  exercising. Moderate-intensity exercise helps to maintain your  current weight. You can tell that you are exercising at a moderate level if you have a higher heart rate and faster breathing, but you are still able to hold a conversation. How often should I exercise? Choose an activity that you enjoy and set realistic goals. Your health care provider can help you to make an activity plan that works for you. Exercise regularly as directed by your health care provider. This may include:  Doing resistance training twice each week, such as: ? Push-ups. ? Sit-ups. ? Lifting weights. ? Using resistance bands.  Doing a given intensity of exercise for a given amount of time. Choose from these options: ? 150 minutes of moderate-intensity exercise every week. ? 75 minutes of vigorous-intensity exercise every week. ? A mix of moderate-intensity and vigorous-intensity exercise every week.  Children, pregnant women, people who are out of shape, people who are overweight, and older adults may need to consult a health care provider for individual recommendations. If you have any sort of medical condition, be sure to consult your health care provider before starting a new exercise program. What are some activities that can help me to lose weight?  Walking at a rate of at least 4.5 miles an hour.  Jogging or running at a rate of 5 miles per hour.  Biking at a rate of at least 10 miles per hour.  Lap swimming.  Roller-skating or in-line skating.  Cross-country skiing.  Vigorous competitive sports, such as football, basketball, and soccer.  Jumping rope.  Aerobic dancing. How can I be more active in my day-to-day activities?  Use the stairs instead of the elevator.  Take a walk during your lunch break.  If you drive, park your car farther away from work or school.  If you take public transportation, get off one stop early and walk the rest of the way.  Make all of your phone calls while standing up and walking around.  Get up, stretch, and walk around  every 30 minutes throughout the day. What guidelines should I follow while exercising?  Do not exercise so much that you hurt yourself, feel dizzy, or get very short of breath.  Consult your health care provider prior to starting a new exercise program.  Wear comfortable clothes and shoes with good support.  Drink plenty of water while you exercise to prevent dehydration or heat stroke. Body water is lost during exercise and must be replaced.  Work out until you breathe faster and your heart beats faster. This information is not intended to replace advice given to you by your health care provider. Make sure you discuss any questions you have with your health care provider. Document Released: 04/27/2010 Document Revised: 08/31/2015 Document Reviewed: 08/26/2013 Elsevier Interactive Patient Education  2018 [employer].  Gout Gout is painful swelling that can occur in some of your joints. Gout is a type of arthritis. This condition is caused by having too much uric acid in your body. Uric acid is a chemical that forms when your body breaks down substances called purines. Purines are important for building body proteins. When your body has too much uric acid, sharp crystals can form and build up inside your joints. This causes pain and swelling. Gout attacks can happen quickly and be very painful (acute gout). Over time, the attacks can affect more joints and become more frequent (chronic gout). Gout can also cause uric acid to build up under your skin and inside  your kidneys. What are the causes? This condition is caused by too much uric acid in your blood. This can occur because:  Your kidneys do not remove enough uric acid from your blood. This is the most common cause.  Your body makes too much uric acid. This can occur with some cancers and cancer treatments. It can also occur if your body is breaking down too many red blood cells (hemolytic anemia).  You eat too many foods that are high  in purines. These foods include organ meats and some seafood. Alcohol, especially beer, is also high in purines.  A gout attack may be triggered by trauma or stress. What increases the risk? This condition is more likely to develop in people who:  Have a family history of gout.  Are male and middle-aged.  Are male and have gone through menopause.  Are obese.  Frequently drink alcohol, especially beer.  Are dehydrated.  Lose weight too quickly.  Have an organ transplant.  Have lead poisoning.  Take certain medicines, including aspirin, cyclosporine, diuretics, levodopa, and niacin.  Have kidney disease or psoriasis.  What are the signs or symptoms? An attack of acute gout happens quickly. It usually occurs in just one joint. The most common place is the big toe. Attacks often start at night. Other joints that may be affected include joints of the feet, ankle, knee, fingers, wrist, or elbow. Symptoms may include:  Severe pain.  Warmth.  Swelling.  Stiffness.  Tenderness. The affected joint may be very painful to touch.  Shiny, red, or purple skin.  Chills and fever.  Chronic gout may cause symptoms more frequently. More joints may be involved. You may also have white or yellow lumps (tophi) on your hands or feet or in other areas near your joints. How is this diagnosed? This condition is diagnosed based on your symptoms, medical history, and physical exam. You may have tests, such as:  Blood tests to measure uric acid levels.  Removal of joint fluid with a needle (aspiration) to look for uric acid crystals.  X-rays to look for joint damage.  How is this treated? Treatment for this condition has two phases: treating an acute attack and preventing future attacks. Acute gout treatment may include medicines to reduce pain and swelling, including:  NSAIDs.  Steroids. These are strong anti-inflammatory medicines that can be taken by mouth (orally) or injected  into a joint.  Colchicine. This medicine relieves pain and swelling when it is taken soon after an attack. It can be given orally or through an IV tube.  Preventive treatment may include:  Daily use of smaller doses of NSAIDs or colchicine.  Use of a medicine that reduces uric acid levels in your blood.  Changes to your diet. You may need to see a specialist about healthy eating (dietitian).  Follow these instructions at home: During a Gout Attack  If directed, apply ice to the affected area: ? Put ice in a plastic bag. ? Place a towel between your skin and the bag. ? Leave the ice on for 20 minutes, 2-3 times a day.  Rest the joint as much as possible. If the affected joint is in your leg, you may be given crutches to use.  Raise (elevate) the affected joint above the level of your heart as often as possible.  Drink enough fluids to keep your urine clear or pale yellow.  Take over-the-counter and prescription medicines only as told by your health care provider.  Do  not drive or operate heavy machinery while taking prescription pain medicine.  Follow instructions from your health care provider about eating or drinking restrictions.  Return to your normal activities as told by your health care provider. Ask your health care provider what activities are safe for you. Avoiding Future Gout Attacks  Follow a low-purine diet as told by your dietitian or health care provider. Avoid foods and drinks that are high in purines, including liver, kidney, anchovies, asparagus, herring, mushrooms, mussels, and beer.  Limit alcohol intake to no more than 1 drink a day for nonpregnant women and 2 drinks a day for men. One drink equals 12 oz of beer, 5 oz of wine, or 1 oz of hard liquor.  Maintain a healthy weight or lose weight if you are overweight. If you want to lose weight, talk with your health care provider. It is important that you do not lose weight too quickly.  Start or maintain an  exercise program as told by your health care provider.  Drink enough fluids to keep your urine clear or pale yellow.  Take over-the-counter and prescription medicines only as told by your health care provider.  Keep all follow-up visits as told by your health care provider. This is important. Contact a health care provider if:  You have another gout attack.  You continue to have symptoms of a gout attack after10 days of treatment.  You have side effects from your medicines.  You have chills or a fever.  You have burning pain when you urinate.  You have pain in your lower back or belly. Get help right away if:  You have severe or uncontrolled pain.  You cannot urinate. This information is not intended to replace advice given to you by your health care provider. Make sure you discuss any questions you have with your health care provider. Document Released: 03/22/2000 Document Revised: 08/31/2015 Document Reviewed: 01/05/2015 Elsevier Interactive Patient Education  [employer]2018 Else[employer] Inc.

## 2017-10-10 ENCOUNTER — Ambulatory Visit: Payer: Federal, State, Local not specified - PPO | Admitting: Family Medicine

## 2017-10-10 ENCOUNTER — Encounter: Payer: Self-pay | Admitting: Family Medicine

## 2017-10-10 ENCOUNTER — Other Ambulatory Visit: Payer: Self-pay | Admitting: Family Medicine

## 2017-10-10 VITALS — BP 120/70 | HR 74 | Temp 98.5°F | Ht 69.0 in | Wt 305.5 lb

## 2017-10-10 DIAGNOSIS — E119 Type 2 diabetes mellitus without complications: Secondary | ICD-10-CM | POA: Diagnosis not present

## 2017-10-10 DIAGNOSIS — S29019D Strain of muscle and tendon of unspecified wall of thorax, subsequent encounter: Secondary | ICD-10-CM

## 2017-10-10 MED ORDER — OLMESARTAN MEDOXOMIL 5 MG PO TABS: 5.0000 mg | ORAL_TABLET | Freq: Every day | ORAL | 1 refills | Status: DC

## 2017-10-10 MED ORDER — TIZANIDINE HCL 4 MG PO CAPS: 4.0000 mg | ORAL_CAPSULE | Freq: Three times a day (TID) | ORAL | 0 refills | Status: DC | PRN

## 2017-10-10 MED ORDER — IBUPROFEN 800 MG PO TABS: ORAL_TABLET | ORAL | 0 refills | Status: DC

## 2017-10-10 NOTE — Patient Instructions (Signed)
Thoracic Strain A thoracic strain, which is sometimes called a mid-back strain, is an injury to the muscles or tendons that attach to the upper part of your back behind your chest. This type of injury occurs when a muscle is overstretched or overloaded. Thoracic strains can range from mild to severe. Mild strains may involve stretching a muscle or tendon without tearing it. These injuries may heal in 1-2 weeks. More severe strains involve tearing of muscle fibers or tendons. These will cause more pain and may take 6-8 weeks to heal. What are the causes? This condition may be caused by:  An injury in which a sudden force is placed on the muscle.  Exercising without properly warming up.  Overuse of the muscle.  Improper form during certain movements.  Other injuries that surround or cause stress on the mid-back, causing a strain on the muscles.  In some cases, the cause may not be known. What increases the risk? This injury is more common in:  Athletes.  People with obesity.  What are the signs or symptoms? The main symptom of this condition is pain, especially with movement. Other symptoms include:  Bruising.  Swelling.  Spasm.  How is this diagnosed? This condition may be diagnosed with a physical exam. X-rays may be taken to check for a fracture. How is this treated? This condition may be treated with:  Resting and icing the injured area.  Physical therapy. This will involve doing stretching and strengthening exercises.  Medicines for pain and inflammation.  Follow these instructions at home:  Rest as needed. Follow instructions from your health care provider about any restrictions on activity.  If directed, apply ice to the injured area: ? Put ice in a plastic bag. ? Place a towel between your skin and the bag. ? Leave the ice on for 20 minutes, 2-3 times per day.  Take over-the-counter and prescription medicines only as told by your health care  provider.  Begin doing exercises as told by your health care provider or physical therapist.  Always warm up properly before physical activity or sports.  Bend your knees before you lift heavy objects.  Keep all follow-up visits as told by your health care provider. This is important. Contact a health care provider if:  Your pain is not helped by medicine.  Your pain, bruising, or swelling is getting worse.  You have a fever. Get help right away if:  You have shortness of breath.  You have chest pain.  You develop numbness or weakness in your legs.  You have involuntary loss of urine (urinary incontinence). This information is not intended to replace advice given to you by your health care provider. Make sure you discuss any questions you have with your health care provider. Document Released: 06/15/2003 Document Revised: 11/25/2015 Document Reviewed: 05/19/2014 Elsevier Interactive Patient Education  2018 Else[employer] Inc.  

## 2017-10-10 NOTE — Progress Notes (Signed)
Subjective:  Patient ID: Wayne Castillo, male    DOB: 1980/06/14  Age: 37 y.o. MRN: [id number]  CC: Back Pain (x 3 weeks, muscle spasms)   HPI Wayne Castillo presents for evaluation of a 3-week history of pain in his upper back has been radiating around to the front of his chest wall on the right just below his breast area.  No specific injury.  He feels as though the muscles in this area have been tightening and loosening on their own.  There is increased discomfort with movement through the chest wall and when he extends his arms outward.  He works in the [employer].  There is been no change in his bowel or bladder habits.  He has not noticed a rash and denies increased recent stress.  Patient has been taking over-the-counter Advil Robaxin.  He does not feel like these are helping much.  He did not fill his olmesartan because he thought that he was done with it.   Outpatient Medications Prior to Visit  Medication Sig Dispense Refill  . allopurinol (ZYLOPRIM) 300 MG tablet Take 1 tablet (300 mg total) by mouth daily. 90 tablet 1  . metFORMIN (GLUCOPHAGE) 500 MG tablet Take 1 tablet (500 mg total) by mouth 2 (two) times daily with a meal. 180 tablet 1  . methocarbamol (ROBAXIN) 500 MG tablet Take 1 tablet (500 mg total) by mouth 4 (four) times daily. As needed 60 tablet 1  . olmesartan (BENICAR) 5 MG tablet Take 1 tablet (5 mg total) by mouth daily. 90 tablet 1   No facility-administered medications prior to visit.     ROS Review of Systems  Constitutional: Negative for chills, fatigue, fever and unexpected weight change.  HENT: Negative.   Eyes: Negative.   Respiratory: Negative for chest tightness and shortness of breath.   Cardiovascular: Negative for chest pain, palpitations and leg swelling.  Gastrointestinal: Negative for abdominal pain, anal bleeding, blood in stool, diarrhea, nausea and vomiting.  Endocrine: Negative for polyphagia and polyuria.  Genitourinary: Negative for  decreased urine volume and frequency.  Musculoskeletal: Positive for back pain and myalgias.  Skin: Negative.   Allergic/Immunologic: Negative for immunocompromised state.  Neurological: Negative for headaches.  Psychiatric/Behavioral: Negative.     Objective:  BP 120/70   Pulse 74   Temp 98.5 F (36.9 C)   Ht 5\' 9"  (1.753 m)   Wt (!) 305 lb 8 oz (138.6 kg)   SpO2 96%   BMI 45.11 kg/m   BP Readings from Last 3 Encounters:  10/10/17 120/70  07/08/17 124/74  06/17/17 120/68    Wt Readings from Last 3 Encounters:  10/10/17 (!) 305 lb 8 oz (138.6 kg)  07/08/17 (!) 313 lb 6 oz (142.1 kg)  06/17/17 (!) 314 lb 2 oz (142.5 kg)    Physical Exam  Constitutional: He is oriented to person, place, and time. He appears well-developed and well-nourished. No distress.  HENT:  Head: Normocephalic and atraumatic.  Right Ear: External ear normal.  Left Ear: External ear normal.  Mouth/Throat: Oropharynx is clear and moist. No oropharyngeal exudate.  Eyes: Pupils are equal, round, and reactive to light. Conjunctivae and EOM are normal. Right eye exhibits no discharge. Left eye exhibits no discharge. No scleral icterus.  Neck: Neck supple. No JVD present. No tracheal deviation present. No thyromegaly present.  Cardiovascular: Normal rate, regular rhythm and normal heart sounds.  Pulmonary/Chest: Effort normal and breath sounds normal.  Abdominal: Soft. Bowel sounds are  normal.  Musculoskeletal:       Thoracic back: He exhibits normal range of motion, no tenderness, no bony tenderness and no spasm.  Lymphadenopathy:    He has no cervical adenopathy.  Neurological: He is alert and oriented to person, place, and time.  Skin: Skin is warm and dry. Capillary refill takes less than 2 seconds. No rash noted. He is not diaphoretic. No pallor.  Psychiatric: He has a normal mood and affect. His behavior is normal.    Lab Results  Component Value Date   WBC 6.9 07/08/2017   HGB 14.6 07/08/2017    HCT 43.8 07/08/2017   PLT 244.0 07/08/2017   GLUCOSE 119 (H) 07/08/2017   CHOL 151 07/08/2017   TRIG 107.0 07/08/2017   HDL 44.30 07/08/2017   LDLCALC 85 07/08/2017   ALT 34 07/08/2017   AST 28 07/08/2017   NA 138 07/08/2017   K 4.7 07/08/2017   CL 102 07/08/2017   CREATININE 0.79 07/08/2017   BUN 15 07/08/2017   CO2 29 07/08/2017   HGBA1C 6.3 07/08/2017   MICROALBUR 0.9 07/08/2017    No results found.  Assessment & Plan:   Wayne Castillo was seen today for back pain.  Diagnoses and all orders for this visit:  Thoracic myofascial strain, subsequent encounter -     ibuprofen (ADVIL,MOTRIN) 800 MG tablet; One every 8 hours with food for 7 days and then as needed. -     tiZANidine (ZANAFLEX) 4 MG capsule; Take 1 capsule (4 mg total) by mouth 3 (three) times daily as needed for muscle spasms.  Controlled type 2 diabetes mellitus without complication, without long-term current use of insulin (HCC) -     olmesartan (BENICAR) 5 MG tablet; Take 1 tablet (5 mg total) by mouth daily.   I am having Wayne Castillo. Wayne Castillo start on ibuprofen and tiZANidine. I am also having him maintain his methocarbamol, allopurinol, metFORMIN, and olmesartan.  Meds ordered this encounter  Medications  . ibuprofen (ADVIL,MOTRIN) 800 MG tablet    Sig: One every 8 hours with food for 7 days and then as needed.    Dispense:  50 tablet    Refill:  0  . tiZANidine (ZANAFLEX) 4 MG capsule    Sig: Take 1 capsule (4 mg total) by mouth 3 (three) times daily as needed for muscle spasms.    Dispense:  30 capsule    Refill:  0  . olmesartan (BENICAR) 5 MG tablet    Sig: Take 1 tablet (5 mg total) by mouth daily.    Dispense:  90 tablet    Refill:  1   Anticipatory guidance was given with regards to treating thoracic back strains.  He will look out for the development of any rash in the affected area.  We will go ahead and try Zanaflex.  Drowsy precautions were given to the patient.  Suggested that he use Zanaflex at  night and continue his Robaxin and the daytime.  Discussed the rationale for using Medicare at low dose to help protect his kidneys with regards to his diabetes.  Follow-up: Return in about 1 week (around 10/17/2017), or if symptoms worsen or fail to improve.  Mliss Sax, MD

## 2017-12-01 ENCOUNTER — Encounter: Payer: Self-pay | Admitting: Family Medicine

## 2017-12-29 ENCOUNTER — Other Ambulatory Visit: Payer: Self-pay | Admitting: Family Medicine

## 2017-12-29 DIAGNOSIS — E119 Type 2 diabetes mellitus without complications: Secondary | ICD-10-CM

## 2017-12-29 NOTE — Telephone Encounter (Signed)
Was given 6 mos of med in April. Should already have a refill. Due for fu in October.

## 2018-01-07 ENCOUNTER — Ambulatory Visit: Payer: Federal, State, Local not specified - PPO | Admitting: Family Medicine

## 2018-01-13 ENCOUNTER — Ambulatory Visit: Payer: Federal, State, Local not specified - PPO | Admitting: Family Medicine

## 2018-01-13 ENCOUNTER — Encounter: Payer: Self-pay | Admitting: Family Medicine

## 2018-01-13 VITALS — BP 126/80 | HR 63 | Temp 97.6°F | Ht 69.0 in | Wt 314.4 lb

## 2018-01-13 DIAGNOSIS — E119 Type 2 diabetes mellitus without complications: Secondary | ICD-10-CM

## 2018-01-13 DIAGNOSIS — Z6841 Body Mass Index (BMI) 40.0 and over, adult: Secondary | ICD-10-CM

## 2018-01-13 DIAGNOSIS — M109 Gout, unspecified: Secondary | ICD-10-CM | POA: Diagnosis not present

## 2018-01-13 DIAGNOSIS — Z0001 Encounter for general adult medical examination with abnormal findings: Secondary | ICD-10-CM

## 2018-01-13 DIAGNOSIS — Z23 Encounter for immunization: Secondary | ICD-10-CM | POA: Diagnosis not present

## 2018-01-13 LAB — MICROALBUMIN / CREATININE URINE RATIO
CREATININE, U: 76.5 mg/dL
Microalb Creat Ratio: 0.9 mg/g (ref 0.0–30.0)

## 2018-01-13 LAB — COMPREHENSIVE METABOLIC PANEL
ALBUMIN: 4.1 g/dL (ref 3.5–5.2)
ALT: 34 U/L (ref 0–53)
AST: 26 U/L (ref 0–37)
Alkaline Phosphatase: 75 U/L (ref 39–117)
BILIRUBIN TOTAL: 0.4 mg/dL (ref 0.2–1.2)
BUN: 14 mg/dL (ref 6–23)
CO2: 29 meq/L (ref 19–32)
CREATININE: 0.81 mg/dL (ref 0.40–1.50)
Calcium: 9.3 mg/dL (ref 8.4–10.5)
Chloride: 103 mEq/L (ref 96–112)
GFR: 113.32 mL/min (ref >60.00)
Glucose, Bld: 129 mg/dL — ABNORMAL HIGH (ref 70–99)
Potassium: 4.3 mEq/L (ref 3.5–5.1)
Sodium: 138 mEq/L (ref 135–145)
Total Protein: 7.1 g/dL (ref 6.0–8.3)

## 2018-01-13 LAB — URINALYSIS, ROUTINE W REFLEX MICROSCOPIC
Bilirubin Urine: NEGATIVE
HGB URINE DIPSTICK: NEGATIVE
Ketones, ur: NEGATIVE
Leukocytes, UA: NEGATIVE
NITRITE: NEGATIVE
RBC / HPF: NONE SEEN (ref 0–?)
SPECIFIC GRAVITY, URINE: 1.02 (ref 1.000–1.030)
TOTAL PROTEIN, URINE-UPE24: NEGATIVE
Urine Glucose: NEGATIVE
Urobilinogen, UA: 0.2 (ref 0.0–1.0)
WBC, UA: NONE SEEN (ref 0–?)
pH: 6 (ref 5.0–8.0)

## 2018-01-13 LAB — CBC
HCT: 42.4 % (ref 39.0–52.0)
Hemoglobin: 14.2 g/dL (ref 13.0–17.0)
MCHC: 33.6 g/dL (ref 30.0–36.0)
MCV: 87.4 fl (ref 78.0–100.0)
Platelets: 242 10*3/uL (ref 150.0–400.0)
RBC: 4.85 Mil/uL (ref 4.22–5.81)
RDW: 13.1 % (ref 11.5–15.5)
WBC: 6.3 10*3/uL (ref 4.0–10.5)

## 2018-01-13 LAB — LIPID PANEL
CHOL/HDL RATIO: 4
CHOLESTEROL: 145 mg/dL (ref 0–200)
HDL: 39.9 mg/dL (ref >39.00)
LDL CALC: 75 mg/dL (ref 0–99)
NONHDL: 105.24
Triglycerides: 153 mg/dL — ABNORMAL HIGH (ref 0.0–149.0)
VLDL: 30.6 mg/dL (ref 0.0–40.0)

## 2018-01-13 LAB — URIC ACID: Uric Acid, Serum: 4.8 mg/dL (ref 4.0–7.8)

## 2018-01-13 LAB — TSH: TSH: 0.61 u[IU]/mL (ref 0.35–4.50)

## 2018-01-13 LAB — HEMOGLOBIN A1C: HEMOGLOBIN A1C: 6.4 % (ref 4.6–6.5)

## 2018-01-13 NOTE — Patient Instructions (Addendum)
Exercising to Lose Weight Exercising can help you to lose weight. In order to lose weight through exercise, you need to do vigorous-intensity exercise. You can tell that you are exercising with vigorous intensity if you are breathing very hard and fast and cannot hold a conversation while exercising. Moderate-intensity exercise helps to maintain your current weight. You can tell that you are exercising at a moderate level if you have a higher heart rate and faster breathing, but you are still able to hold a conversation. How often should I exercise? Choose an activity that you enjoy and set realistic goals. Your health care provider can help you to make an activity plan that works for you. Exercise regularly as directed by your health care provider. This may include:  Doing resistance training twice each week, such as: ? Push-ups. ? Sit-ups. ? Lifting weights. ? Using resistance bands.  Doing a given intensity of exercise for a given amount of time. Choose from these options: ? 150 minutes of moderate-intensity exercise every week. ? 75 minutes of vigorous-intensity exercise every week. ? A mix of moderate-intensity and vigorous-intensity exercise every week.  Children, pregnant women, people who are out of shape, people who are overweight, and older adults may need to consult a health care provider for individual recommendations. If you have any sort of medical condition, be sure to consult your health care provider before starting a new exercise program. What are some activities that can help me to lose weight?  Walking at a rate of at least 4.5 miles an hour.  Jogging or running at a rate of 5 miles per hour.  Biking at a rate of at least 10 miles per hour.  Lap swimming.  Roller-skating or in-line skating.  Cross-country skiing.  Vigorous competitive sports, such as football, basketball, and soccer.  Jumping rope.  Aerobic dancing. How can I be more active in my day-to-day  activities?  Use the stairs instead of the elevator.  Take a walk during your lunch break.  If you drive, park your car farther away from work or school.  If you take public transportation, get off one stop early and walk the rest of the way.  Make all of your phone calls while standing up and walking around.  Get up, stretch, and walk around every 30 minutes throughout the day. What guidelines should I follow while exercising?  Do not exercise so much that you hurt yourself, feel dizzy, or get very short of breath.  Consult your health care provider prior to starting a new exercise program.  Wear comfortable clothes and shoes with good support.  Drink plenty of water while you exercise to prevent dehydration or heat stroke. Body water is lost during exercise and must be replaced.  Work out until you breathe faster and your heart beats faster. This information is not intended to replace advice given to you by your health care provider. Make sure you discuss any questions you have with your health care provider. Document Released: 04/27/2010 Document Revised: 08/31/2015 Document Reviewed: 08/26/2013 Elsevier Interactive Patient Education  2018 [employer].  Preventive Dental Care, Adult Preventive dental care includes seeing a dentist regularly and practicing good dental care (oral hygiene) at home. These actions can help to prevent cavities and other tooth problems, root canal problems, gum disease (gingivitis), and tooth loss. Regular dental exams may also help your health care provider to diagnose other medical problems. Many diseases, including mouth cancers, have early signs that can be found during  a preventive dental care visit. What can I expect during my dental visits? Many adults see their dentist one or two times each year for oral exams and cleanings. Talk with your dentist about the best preventive dental care schedule for you. At your visit, your dentist may ask you  about:  Your overall health and diet.  Any new symptoms, such as: ? Bleeding gums. ? Mouth, tooth, or jaw pain.  Your dentist will do a mouth (oral) exam to check for:  Cavities.  Gingivitis or other problems.  Signs of cancer.  Neck swelling or lumps.  Abnormal jaw movement or pain in the jaw joint.  You may also have:  Dental X-rays.  Your teeth cleaned.  If you have an early problem, like a cavity, your dentist will schedule time for you to get treatment. If you have a tooth root problem, gum disease, or a sign of another disease, your dentist may send you to see another health care provider for care. How can I care for my teeth at home?  Brush with an approved fluoride toothpaste every morning and night. If possible, brush within 10 minutes after every meal.  Floss one time every day.  Periodically check your teeth for white or brown spots after brushing. These may be signs of cavities.  Check your gums for swelling or bleeding. These may be signs of gum disease, such as gingivitis or periodontitis.  Make sure your diet includes plenty of fruits, vegetables, milk and dairy products, whole grains, and proteins. Do not eat a lot of starchy foods or foods with added sugar. Talk with your health care provider if you have questions about following a healthy diet.  Avoid sodas, sugary snacks, and sticky candies.  Do not smoke.  Do not get mouth piercings.  If you have tooth or gum pain, gargle with a salt-water mixture 3-4 times per day or as needed. To make a salt-water mixture, completely dissolve -1 tsp of salt in 1 cup of warm water.  Take over-the counter and prescription medicines only as told by your dentist.  If you have a permanent tooth knocked out: ? Find the tooth. ? Pick it up by the top (crown) with a tissue or gauze. ? Wash the tooth for no more than 10 seconds under cold, running water. ? Try to put the tooth back into the gum socket. ? Put the  tooth in a glass of milk if you cannot get it back in place. ? Go to your dentist right away. Take the tooth with you. When should I seek medical care? Call your dentist if you have:  Gum, tooth, or jaw pain.  Red, swollen, or bleeding gums.  A tooth or teeth that are very sensitive to hot or cold.  Very bad breath.  A problem with a filling, crown, implant, or denture.  A broken or loose tooth.  A growth or sore in your mouth that is not going away.  Where to find more information: American Dental Association: http://fox-wallace.com/ This information is not intended to replace advice given to you by your health care provider. Make sure you discuss any questions you have with your health care provider. Document Released: 12/14/2014 Document Revised: 08/31/2015 Document Reviewed: 09/06/2014 Elsevier Interactive Patient Education  2018 [employer].  Health Maintenance, Male A healthy lifestyle and preventive care is important for your health and wellness. Ask your health care provider about what schedule of regular examinations is right for you. What should I  know about weight and diet? Eat a Healthy Diet  Eat plenty of vegetables, fruits, whole grains, low-fat dairy products, and lean protein.  Do not eat a lot of foods high in solid fats, added sugars, or salt.  Maintain a Healthy Weight Regular exercise can help you achieve or maintain a healthy weight. You should:  Do at least 150 minutes of exercise each week. The exercise should increase your heart rate and make you sweat (moderate-intensity exercise).  Do strength-training exercises at least twice a week.  Watch Your Levels of Cholesterol and Blood Lipids  Have your blood tested for lipids and cholesterol every 5 years starting at 38 years of age. If you are at high risk for heart disease, you should start having your blood tested when you are 38 years old. You may need to have your cholesterol levels checked more often  if: ? Your lipid or cholesterol levels are high. ? You are older than 38 years of age. ? You are at high risk for heart disease.  What should I know about cancer screening? Many types of cancers can be detected early and may often be prevented. Lung Cancer  You should be screened every year for lung cancer if: ? You are a current smoker who has smoked for at least 30 years. ? You are a former smoker who has quit within the past 15 years.  Talk to your health care provider about your screening options, when you should start screening, and how often you should be screened.  Colorectal Cancer  Routine colorectal cancer screening usually begins at 38 years of age and should be repeated every 5-10 years until you are 38 years old. You may need to be screened more often if early forms of precancerous polyps or small growths are found. Your health care provider may recommend screening at an earlier age if you have risk factors for colon cancer.  Your health care provider may recommend using home test kits to check for hidden blood in the stool.  A small camera at the end of a tube can be used to examine your colon (sigmoidoscopy or colonoscopy). This checks for the earliest forms of colorectal cancer.  Prostate and Testicular Cancer  Depending on your age and overall health, your health care provider may do certain tests to screen for prostate and testicular cancer.  Talk to your health care provider about any symptoms or concerns you have about testicular or prostate cancer.  Skin Cancer  Check your skin from head to toe regularly.  Tell your health care provider about any new moles or changes in moles, especially if: ? There is a change in a mole's size, shape, or color. ? You have a mole that is larger than a pencil eraser.  Always use sunscreen. Apply sunscreen liberally and repeat throughout the day.  Protect yourself by wearing long sleeves, pants, a wide-brimmed hat, and  sunglasses when outside.  What should I know about heart disease, diabetes, and high blood pressure?  If you are 8-52 years of age, have your blood pressure checked every 3-5 years. If you are 63 years of age or older, have your blood pressure checked every year. You should have your blood pressure measured twice-once when you are at a hospital or clinic, and once when you are not at a hospital or clinic. Record the average of the two measurements. To check your blood pressure when you are not at a hospital or clinic, you can use: ?  An automated blood pressure machine at a pharmacy. ? A home blood pressure monitor.  Talk to your health care provider about your target blood pressure.  If you are between 29-58 years old, ask your health care provider if you should take aspirin to prevent heart disease.  Have regular diabetes screenings by checking your fasting blood sugar level. ? If you are at a normal weight and have a low risk for diabetes, have this test once every three years after the age of 31. ? If you are overweight and have a high risk for diabetes, consider being tested at a younger age or more often.  A one-time screening for abdominal aortic aneurysm (AAA) by ultrasound is recommended for men aged 65-75 years who are current or former smokers. What should I know about preventing infection? Hepatitis B If you have a higher risk for hepatitis B, you should be screened for this virus. Talk with your health care provider to find out if you are at risk for hepatitis B infection. Hepatitis C Blood testing is recommended for:  Everyone born from [birth year] through 1966.  Anyone with known risk factors for hepatitis C.  Sexually Transmitted Diseases (STDs)  You should be screened each year for STDs including gonorrhea and chlamydia if: ? You are sexually active and are younger than 38 years of age. ? You are older than 38 years of age and your health care provider tells you that you are  at risk for this type of infection. ? Your sexual activity has changed since you were last screened and you are at an increased risk for chlamydia or gonorrhea. Ask your health care provider if you are at risk.  Talk with your health care provider about whether you are at high risk of being infected with HIV. Your health care provider may recommend a prescription medicine to help prevent HIV infection.  What else can I do?  Schedule regular health, dental, and eye exams.  Stay current with your vaccines (immunizations).  Do not use any tobacco products, such as cigarettes, chewing tobacco, and e-cigarettes. If you need help quitting, ask your health care provider.  Limit alcohol intake to no more than 2 drinks per day. One drink equals 12 ounces of beer, 5 ounces of wine, or 1 ounces of hard liquor.  Do not use street drugs.  Do not share needles.  Ask your health care provider for help if you need support or information about quitting drugs.  Tell your health care provider if you often feel depressed.  Tell your health care provider if you have ever been abused or do not feel safe at home. This information is not intended to replace advice given to you by your health care provider. Make sure you discuss any questions you have with your health care provider. Document Released: 09/21/2007 Document Revised: 11/22/2015 Document Reviewed: 12/27/2014 Elsevier Interactive Patient Education  [employer].

## 2018-01-13 NOTE — Progress Notes (Signed)
Subjective:  Patient ID: Wayne Castillo, male    DOB: Mar 08, 1981  Age: 37 y.o. MRN: [id number]  CC: Follow-up   HPI [name] presents for a physical exam and follow-up for his diabetes and obesity.  He continues to work out at [employer] and just playing walk but that does not seem to be helping.  He has attended seminars for lap band.  He has been on interested in having this procedure done until more recently when he realizes that he will need some help to wait.  He is planning follow-up with the bariatric surgeons.  He has not had an ophthalmology will call check.  He has had no dental care since high school.  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  He does have friends that he sees on the weekends.  He does admit fatigue in the afternoon and feels unrelated motivated to exercise on most days after work he is having no issues taking his medicines.  He does avow compliance with his medicines.  Outpatient Medications Prior to Visit  Medication Sig Dispense Refill  . allopurinol (ZYLOPRIM) 300 MG tablet Take 1 tablet (300 mg total) by mouth daily. 90 tablet 1  . metFORMIN (GLUCOPHAGE) 500 MG tablet Take 1 tablet (500 mg total) by mouth 2 (two) times daily with a meal. 180 tablet 1  . olmesartan (BENICAR) 5 MG tablet Take 1 tablet (5 mg total) by mouth daily. 90 tablet 1  . ibuprofen (ADVIL,MOTRIN) 800 MG tablet One every 8 hours with food for 7 days and then as needed. 50 tablet 0  . methocarbamol (ROBAXIN) 500 MG tablet Take 1 tablet (500 mg total) by mouth 4 (four) times daily. As needed 60 tablet 1  . tiZANidine (ZANAFLEX) 4 MG capsule Take 1 capsule (4 mg total) by mouth 3 (three) times daily as needed for muscle spasms. 30 capsule 0   No facility-administered medications prior to visit.     ROS Review of Systems  Constitutional: Positive for fatigue. Negative for chills, fever and unexpected weight change.  HENT: Negative.   Eyes: Negative.   Respiratory: Negative.    Cardiovascular: Negative.   Gastrointestinal: Negative.   Endocrine: Negative for polyphagia and polyuria.  Genitourinary: Negative.   Musculoskeletal: Negative for gait problem and joint swelling.  Skin: Negative for pallor and rash.  Allergic/Immunologic: Negative for immunocompromised state.  Psychiatric/Behavioral: Negative.     Objective:  BP 126/80   Pulse 63   Temp 97.6 F (36.4 C) (Oral)   Ht 5\' 9"  (1.753 m)   Wt (!) 314 lb 6 oz (142.6 kg)   SpO2 97%   BMI 46.43 kg/m   BP Readings from Last 3 Encounters:  01/13/18 126/80  10/10/17 120/70  07/08/17 124/74    Wt Readings from Last 3 Encounters:  01/13/18 (!) 314 lb 6 oz (142.6 kg)  10/10/17 (!) 305 lb 8 oz (138.6 kg)  07/08/17 (!) 313 lb 6 oz (142.1 kg)    Physical Exam  Constitutional: He is oriented to person, place, and time. He appears well-nourished. No distress.  HENT:  Head: Normocephalic and atraumatic.  Right Ear: External ear normal.  Left Ear: External ear normal.  Mouth/Throat: Oropharynx is clear and moist. No oropharyngeal exudate.  Eyes: Pupils are equal, round, and reactive to light. Conjunctivae and EOM are normal. Right eye exhibits no discharge. Left eye exhibits no discharge. No scleral icterus.  Neck: Neck supple. No JVD present. No  tracheal deviation present. No thyromegaly present.  Cardiovascular: Normal rate, regular rhythm and normal heart sounds.  Pulmonary/Chest: Effort normal and breath sounds normal.  Abdominal: Soft. Bowel sounds are normal. He exhibits no distension and no mass. There is no tenderness. There is no rebound and no guarding. Hernia confirmed negative in the right inguinal area and confirmed negative in the left inguinal area.  Genitourinary: Penis normal. Right testis shows no mass, no swelling and no tenderness. Right testis is descended. Left testis shows no mass, no swelling and no tenderness. Left testis is descended. No hypospadias, penile erythema or penile  tenderness. No discharge found.  Musculoskeletal: He exhibits no edema.  Lymphadenopathy: No inguinal adenopathy noted on the right or left side.  Neurological: He is alert and oriented to person, place, and time.  Skin: Skin is warm and dry. He is not diaphoretic.  Psychiatric: He has a normal mood and affect. His behavior is normal.    Lab Results  Component Value Date   WBC 6.9 07/08/2017   HGB 14.6 07/08/2017   HCT 43.8 07/08/2017   PLT 244.0 07/08/2017   GLUCOSE 119 (H) 07/08/2017   CHOL 151 07/08/2017   TRIG 107.0 07/08/2017   HDL 44.30 07/08/2017   LDLCALC 85 07/08/2017   ALT 34 07/08/2017   AST 28 07/08/2017   NA 138 07/08/2017   K 4.7 07/08/2017   CL 102 07/08/2017   CREATININE 0.79 07/08/2017   BUN 15 07/08/2017   CO2 29 07/08/2017   HGBA1C 6.3 07/08/2017   MICROALBUR 0.9 07/08/2017    No results found.  Assessment & Plan:   Eliseo was seen today for follow-up.  Diagnoses and all orders for this visit:  Controlled type 2 diabetes mellitus without complication, without long-term current use of insulin (HCC) -     Comprehensive metabolic panel -     CBC -     Hemoglobin A1c -     Lipid panel -     Urinalysis, Routine w reflex microscopic -     Microalbumin / creatinine urine ratio -     Ambulatory referral to Ophthalmology  Need for influenza vaccination -     Flu Vaccine QUAD 36+ mos IM  Class 3 severe obesity due to excess calories with body mass index (BMI) of 45.0 to 49.9 in adult, unspecified whether serious comorbidity present (HCC) -     TSH  Encounter for health maintenance examination with abnormal findings -     Comprehensive metabolic panel -     CBC -     HIV Antibody (routine testing w rflx) -     Lipid panel -     Urinalysis, Routine w reflex microscopic -     TSH  Gout, unspecified cause, unspecified chronicity, unspecified site -     Uric acid   I have discontinued Tandy Gaw. Willcutt's methocarbamol, ibuprofen, and tiZANidine. I am  also having him maintain his allopurinol, metFORMIN, and olmesartan.  No orders of the defined types were placed in this encounter.  Patient was encouraged to follow-up with bariatric surgeon for a discussion of the LAP-BAND procedure.  He was also encouraged to start dental care.  He was given information for disease prevention and health promotion.  We will continue him on his current medicines for now and plan to follow-up in 6 months unless his labs today dictate otherwise.  Follow-up: Return in about 6 months (around 07/15/2018), or if symptoms worsen or fail to improve.  Chrissie Noa  Krystal Clark, MD

## 2018-01-14 LAB — HIV ANTIBODY (ROUTINE TESTING W REFLEX): HIV: NONREACTIVE

## 2018-01-20 ENCOUNTER — Other Ambulatory Visit: Payer: Self-pay | Admitting: Family Medicine

## 2018-01-20 DIAGNOSIS — E119 Type 2 diabetes mellitus without complications: Secondary | ICD-10-CM

## 2018-02-06 ENCOUNTER — Encounter (INDEPENDENT_AMBULATORY_CARE_PROVIDER_SITE_OTHER): Payer: Self-pay | Admitting: Ophthalmology

## 2018-02-09 NOTE — Progress Notes (Signed)
[facility] - Clinic Note  02/11/2018     CHIEF COMPLAINT Patient presents for Diabetic Eye Exam   HISTORY OF PRESENT ILLNESS: Wayne Castillo is a 37 y.o. male who presents to the clinic today for:   HPI    Diabetic Eye Exam    Vision is stable.  Associated Symptoms Negative for Flashes, Blind Spot, Photophobia, Scalp Tenderness, Fever, Weight Loss, Jaw Claudication, Glare, Pain, Floaters, Fatigue, Shoulder/Hip pain, Trauma, Redness and Distortion.  Diabetes characteristics include Type 2.  This started 2 years ago.  Blood sugar level is controlled.  Last A1C 6.3.  I, the attending physician,  performed the HPI with the patient and updated documentation appropriately.          Comments    Pt presents on the referral of Dr. Nadene Rubins for DM exam, pt is Type 2 diabetic dx 1-2 years ago, pts last A1C was 6.3, he states he hasnt checked his blood sugar in awhile bc its been pretty steady, pt takes metformin       Last edited by Rennis Chris, MD on 02/11/2018  9:16 AM. (History)    pt states Dr. Doreene Burke sent him here for a diabetic exam, he states he does not wear glasses and he is having no problems with his vision, pts last A1C was 6.3, pt takes metformin, pt states he had an eye infection a few years ago that he went to the ED in [place] for, pt states he was given gtts that cleared it up  Referring physician: Mliss Sax, MD [address] [place], [place] [address]  HISTORICAL INFORMATION:   Selected notes from the MEDICAL RECORD NUMBER Referred by Dr. Nadene Rubins for DM exam LEE:  Ocular Hx- PMH-DM (A1C: 6.3, takes metformin)    CURRENT MEDICATIONS: No current outpatient medications on file. (Ophthalmic Drugs)   No current facility-administered medications for this visit.  (Ophthalmic Drugs)   Current Outpatient Medications (Other)  Medication Sig  . allopurinol (ZYLOPRIM) 300 MG tablet Take 1 tablet (300 mg total) by mouth  daily.  . metFORMIN (GLUCOPHAGE) 500 MG tablet TAKE 1 TABLET(500 MG) BY MOUTH TWICE DAILY WITH A MEAL  . olmesartan (BENICAR) 5 MG tablet Take 1 tablet (5 mg total) by mouth daily.   No current facility-administered medications for this visit.  (Other)      REVIEW OF SYSTEMS: ROS    Positive for: Endocrine, Eyes   Negative for: Constitutional, Gastrointestinal, Neurological, Skin, Genitourinary, Musculoskeletal, HENT, Cardiovascular, Respiratory, Psychiatric, Allergic/Imm, Heme/Lymph   Last edited by Posey Boyer, COT on 02/11/2018  9:13 AM. (History)       ALLERGIES No Known Allergies  PAST MEDICAL HISTORY Past Medical History:  Diagnosis Date  . Diabetes mellitus without complication (HCC)   . Gout    Past Surgical History:  Procedure Laterality Date  . EAR TUBE REMOVAL    . WISDOM TOOTH EXTRACTION      FAMILY HISTORY Family History  Problem Relation Age of Onset  . Amblyopia Neg Hx   . Blindness Neg Hx   . Cataracts Neg Hx   . Glaucoma Neg Hx   . Macular degeneration Neg Hx   . Retinal detachment Neg Hx   . Strabismus Neg Hx   . Retinitis pigmentosa Neg Hx     SOCIAL HISTORY Social History   Tobacco Use  . Smoking status: Never Smoker  . Smokeless tobacco: Never Used  Substance Use Topics  .  Alcohol use: No  . Drug use: No         OPHTHALMIC EXAM:  Base Eye Exam    Visual Acuity (Snellen - Linear)      Right Left   Dist [place] 20/50 -2 20/40   Dist ph [place] 20/20 -1 20/25 +1       Tonometry (Tonopen, 9:19 AM)      Right Left   Pressure 18 20       Pupils      Dark Light Shape React APD   Right 5 2 Round Brisk None   Left 5 2 Round Brisk None       Visual Fields (Counting fingers)      Left Right    Full Full       Extraocular Movement      Right Left    Full, Ortho Full, Ortho       Neuro/Psych    Oriented x3:  Yes   Mood/Affect:  Normal       Dilation    Both eyes:  1.0% Mydriacyl, 2.5% Phenylephrine @ 9:19 AM         Slit Lamp and Fundus Exam    Slit Lamp Exam      Right Left   Lids/Lashes Normal Normal   Conjunctiva/Sclera White and quiet White and quiet   Cornea Clear Clear   Anterior Chamber Deep and clear Deep and clear   Iris Round and dilated, No NVI Round and dilated, No NVI   Lens 1+ Nuclear sclerosis, 1+ Cortical cataract, punctate pigmented deposition on central anterior capsule 1+ Nuclear sclerosis, 1+ Cortical cataract,   Vitreous Normal Normal       Fundus Exam      Right Left   Disc Pink and Sharp Pink and Sharp   C/D Ratio 0.2 0.2   Macula Flat, Good foveal reflex, No heme or edema Flat, Good foveal reflex, No heme or edema, single IRH inferior to fovea   Vessels Tortuous Tortuous   Periphery Attached, No heme  Attached, No heme         Refraction    Manifest Refraction      Sphere Cylinder Dist VA   Right -0.50 Sphere 20/20   Left -1.00 Sphere 20/25          IMAGING AND PROCEDURES  Imaging and Procedures for @TODAY @  OCT, Retina - OU - Both Eyes       Right Eye Quality was good. Central Foveal Thickness: 302. Progression has no prior data. Findings include normal foveal contour, no IRF, no SRF.   Left Eye Quality was good. Central Foveal Thickness: 292. Progression has no prior data. Findings include normal foveal contour, no SRF, no IRF.   Notes *Images captured and stored on drive  Diagnosis / Impression:  NFP, No IRF/SRF OU No DME OU   Clinical management:  See below  Abbreviations: NFP - Normal foveal profile. CME - cystoid macular edema. PED - pigment epithelial detachment. IRF - intraretinal fluid. SRF - subretinal fluid. EZ - ellipsoid zone. ERM - epiretinal membrane. ORA - outer retinal atrophy. ORT - outer retinal tubulation. SRHM - subretinal hyper-reflective material                 ASSESSMENT/PLAN:    ICD-10-CM   1. Diabetes mellitus type 2 without retinopathy (HCC) E11.9   2. Essential hypertension I10   3. Hypertensive  retinopathy of both eyes H35.033   4. Retinal edema H35.81  OCT, Retina - OU - Both Eyes  5. 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 Diabetes mellitus, type 2 without retinopathy, OU - The incidence, risk factors for progression, natural history and treatment options for diabetic retinopathy  were discussed with patient.   - The need for close monitoring of blood glucose, blood pressure, and serum lipids, avoiding cigarette or any type of tobacco, and the need for long term follow up was also discussed with patient. - f/u in 1 year, sooner prn  2,3. Hypertensive retinopathy OU - discussed importance of tight BP control - monitor  4. No retinal edema on exam or OCT  5. Nuclear sclerosis OU - The symptoms of cataract, surgical options, and treatments and risks were discussed with patient. - discussed diagnosis and progression - not yet visually significant - monitor for now   Ophthalmic Meds Ordered this visit:  No orders of the defined types were placed in this encounter.      Return in about 1 year (around 02/12/2019) for DM exam.  There are no Patient Instructions on file for this visit.   Explained the diagnoses, plan, and follow up with the patient and they expressed understanding.  Patient expressed understanding of the importance of proper follow up care.   This document serves as a record of services personally performed by Karie Chimera, MD, PhD. It was created on their behalf by Laurian Brim, OA, an ophthalmic assistant. The creation of this record is the provider's dictation and/or activities during the visit.    Electronically signed by: Laurian Brim, OA  11.04.19 2:15 PM    Karie Chimera, M.D., Ph.D. Diseases & Surgery of the Retina and Vitreous [facility]   I have reviewed the above documentation for accuracy and completeness, and I agree with the above. Karie Chimera, M.D., Ph.D. 02/11/18 2:16 PM    Abbreviations: M  myopia (nearsighted); A astigmatism; H hyperopia (farsighted); P presbyopia; Mrx spectacle prescription;  CTL contact lenses; OD right eye; OS left eye; OU both eyes  XT exotropia; ET esotropia; PEK punctate epithelial keratitis; PEE punctate epithelial erosions; DES dry eye syndrome; MGD meibomian gland dysfunction; ATs artificial tears; PFAT's preservative free artificial tears; NSC nuclear sclerotic cataract; PSC posterior subcapsular cataract; ERM epi-retinal membrane; PVD posterior vitreous detachment; RD retinal detachment; DM diabetes mellitus; DR diabetic retinopathy; NPDR non-proliferative diabetic retinopathy; PDR proliferative diabetic retinopathy; CSME clinically significant macular edema; DME diabetic macular edema; dbh dot blot hemorrhages; CWS cotton wool spot; POAG primary open angle glaucoma; C/D cup-to-disc ratio; HVF humphrey visual field; GVF goldmann visual field; OCT optical coherence tomography; IOP intraocular pressure; BRVO Branch retinal vein occlusion; CRVO central retinal vein occlusion; CRAO central retinal artery occlusion; BRAO branch retinal artery occlusion; RT retinal tear; SB scleral buckle; PPV pars plana vitrectomy; VH Vitreous hemorrhage; PRP panretinal laser photocoagulation; IVK intravitreal kenalog; VMT vitreomacular traction; MH Macular hole;  NVD neovascularization of the disc; NVE neovascularization elsewhere; AREDS age related eye disease study; ARMD age related macular degeneration; POAG primary open angle glaucoma; EBMD epithelial/anterior basement membrane dystrophy; ACIOL anterior chamber intraocular lens; IOL intraocular lens; PCIOL posterior chamber intraocular lens; Phaco/IOL phacoemulsification with intraocular lens placement; PRK photorefractive keratectomy; LASIK laser assisted in situ keratomileusis; HTN hypertension; DM diabetes mellitus; COPD chronic obstructive pulmonary disease

## 2018-02-11 ENCOUNTER — Ambulatory Visit (INDEPENDENT_AMBULATORY_CARE_PROVIDER_SITE_OTHER): Payer: Federal, State, Local not specified - PPO | Admitting: Ophthalmology

## 2018-02-11 ENCOUNTER — Encounter (INDEPENDENT_AMBULATORY_CARE_PROVIDER_SITE_OTHER): Payer: Self-pay | Admitting: Ophthalmology

## 2018-02-11 DIAGNOSIS — H35033 Hypertensive retinopathy, bilateral: Secondary | ICD-10-CM

## 2018-02-11 DIAGNOSIS — H3581 Retinal edema: Secondary | ICD-10-CM | POA: Diagnosis not present

## 2018-02-11 DIAGNOSIS — E119 Type 2 diabetes mellitus without complications: Secondary | ICD-10-CM | POA: Diagnosis not present

## 2018-02-11 DIAGNOSIS — I10 Essential (primary) hypertension: Secondary | ICD-10-CM

## 2018-02-11 DIAGNOSIS — H2513 Age-related nuclear cataract, bilateral: Secondary | ICD-10-CM

## 2018-03-06 ENCOUNTER — Other Ambulatory Visit: Payer: Self-pay | Admitting: Family Medicine

## 2018-03-06 DIAGNOSIS — M109 Gout, unspecified: Secondary | ICD-10-CM

## 2018-03-25 ENCOUNTER — Other Ambulatory Visit: Payer: Self-pay | Admitting: Family Medicine

## 2018-03-25 DIAGNOSIS — E119 Type 2 diabetes mellitus without complications: Secondary | ICD-10-CM

## 2018-07-01 ENCOUNTER — Other Ambulatory Visit: Payer: Self-pay | Admitting: Family Medicine

## 2018-07-01 DIAGNOSIS — E119 Type 2 diabetes mellitus without complications: Secondary | ICD-10-CM

## 2018-07-03 DIAGNOSIS — E1169 Type 2 diabetes mellitus with other specified complication: Secondary | ICD-10-CM | POA: Diagnosis not present

## 2018-07-08 ENCOUNTER — Other Ambulatory Visit: Payer: Self-pay | Admitting: Surgery

## 2018-07-08 ENCOUNTER — Other Ambulatory Visit (HOSPITAL_COMMUNITY): Payer: Self-pay | Admitting: Surgery

## 2018-07-16 ENCOUNTER — Ambulatory Visit: Payer: Federal, State, Local not specified - PPO | Admitting: Family Medicine

## 2018-07-29 ENCOUNTER — Telehealth: Payer: Self-pay | Admitting: Family Medicine

## 2018-07-29 NOTE — Telephone Encounter (Signed)
I called and left message on voicemail to call office and schedule appointment for CPE with Dr. Doreene Burke.

## 2018-08-06 DIAGNOSIS — K080 Exfoliation of teeth due to systemic causes: Secondary | ICD-10-CM | POA: Diagnosis not present

## 2018-08-11 ENCOUNTER — Ambulatory Visit: Payer: Federal, State, Local not specified - PPO | Admitting: Dietician

## 2018-08-12 ENCOUNTER — Ambulatory Visit (HOSPITAL_COMMUNITY): Payer: Federal, State, Local not specified - PPO

## 2018-08-25 ENCOUNTER — Other Ambulatory Visit: Payer: Self-pay

## 2018-08-25 ENCOUNTER — Encounter: Payer: Self-pay | Admitting: Dietician

## 2018-08-25 ENCOUNTER — Encounter: Payer: Federal, State, Local not specified - PPO | Attending: Surgery | Admitting: Dietician

## 2018-08-25 VITALS — Ht 69.0 in | Wt 321.0 lb

## 2018-08-25 DIAGNOSIS — E669 Obesity, unspecified: Secondary | ICD-10-CM

## 2018-08-25 NOTE — Progress Notes (Signed)
Bariatric Pre-Op Nutrition Assessment Medical Nutrition Therapy  Appt Start Time: 8:45am  End time: 9:45am  Patient was seen on 08/25/2018 for Pre-Operative Nutrition Assessment. Assessment and letter of approval faxed to [facility] Bariatric Surgery Program coordinator on 08/25/2018.   Planned surgery: Sleeve Gastrectomy  Pt expectation of surgery: weight loss (to not be considered "morbidly obese"), fit into smaller clothing, avoid potential future health issues, less back pain and generally feel better Pt expectation of dietitian: none stated   Anthropometrics  Start weight at [facility]: 321 lbs (date: 08/25/2018) Height: 69 in BMI: 47.4 kg/m2    Clinical  Medical Hx: obesity, T2DM (does not check BG)  Surgeries: N/A  Medications: allopurinol, metformin, olmesartan medoxomil  Allergies: N/A   Psychosocial/Lifestyle Pt works as a Advertising account planner, lives alone. Parents divorced when he was about 89 years old, which is when weight gain began. 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 States he noticed a coworker had lost a lot of weight and found out it was related to bariatric surgery, pt also states his mom has suggested surgery for a while, which both increased his interest in surgery.    24-Hr Dietary Recall First Meal: egg burrito (or biscuit)  Snack: cherry tomatoes (or chips, or apple) + soda  Second Meal: ham & [place] sandwich + cheese stick (or pasta salad, or veggies, or chips) + water  Snack: usually none  Third Meal: chicken noodle soup (or chili) + sweet tea Snack: peanuts (or chips)  Beverages: soda (Coke), water, sweet tea   Food & Nutrition Related Hx Dietary Hx: No particular foods avoided. If eats out, likes [address] and [address]. Typically packs lunch. Likes to snack on a variety of things, such as vegetables, chips, fruit, nuts, or whatever is in the house.  Estimated Daily Fluid Intake: 64+ oz Supplements: none  GI / Other Notable Symptoms: N/A   Physical  Activity  Current average weekly physical activity: walking several miles a day at work, used to go to the gym before COVID-19 pandemic caused gym closures   Estimated Energy Needs Calories: 2200 Carbohydrate: 248g Protein: 138g Fat: 73g  Pre-Op Goals Reviewed with the Patient . Track food and beverage intake (try MyFitness Pal or the Baritastic app) . Make healthy food choices while monitoring portion sizes . Consume 3 meals per day or try to eat every 3-5 hours . Avoid concentrated sugars and fried foods . Keep sugar & fat in the single digits per serving on food labels . Practice CHEWING your food (aim for applesauce consistency) . Practice not drinking 15 minutes before, during, and 30 minutes after each meal and snack . Avoid all carbonated beverages (ex: soda, sparkling beverages)  . Limit caffeinated beverages (ex: coffee, tea, energy drinks) . Avoid all sugar-sweetened beverages (ex: regular soda, sports drinks)  . Avoid alcohol  . Aim for 64-100 ounces of FLUID daily (with at least half of fluid intake being plain water)  . Aim for at least 60-80 grams of PROTEIN daily . Look for a liquid protein source that contains ?15 g protein and ?5 g carbohydrate (ex: shakes, drinks, shots) . Make a list of non-food related activities . Physical activity is an important part of a healthy lifestyle so keep it moving! The goal is to reach 150 minutes of exercise per week, including cardiovascular and weight baring activity.  *Goals that are bolded indicate the pt would like to start working towards these  Handouts Provided Include  .  Bariatric Surgery handouts (Nutrition Visits, Pre-Op Goals, Protein Shakes, Vitamins & Minerals)  Learning Style & Readiness for Change Teaching method utilized: Visual & Auditory  Demonstrated degree of understanding via: Teach Back  Barriers to learning/adherence to lifestyle change: None Identified   RD's Notes for Next Visit . Discuss mindfulness,  chewing, and diet advancement after surgery   Next Steps Supervised Weight Loss (SWL) Visits Needed: 3  Patient is to return to [facility] in 1 month for 1st SWL Visit.  Patient is to call [facility] to enroll in Pre-Op Class (>2 weeks before surgery) and Post-Op Class (2 weeks after surgery) for further nutrition education when surgery date is scheduled.

## 2018-08-25 NOTE — Patient Instructions (Addendum)
Begin working through your Pre-Op Goals discussed today, starting with:  Practice not drinking 15 minutes before, during, and 30 minutes after each meal and snack    See you next month for your first Supervised Visit!

## 2018-08-27 ENCOUNTER — Ambulatory Visit (INDEPENDENT_AMBULATORY_CARE_PROVIDER_SITE_OTHER): Payer: Federal, State, Local not specified - PPO | Admitting: Family Medicine

## 2018-08-27 ENCOUNTER — Encounter: Payer: Self-pay | Admitting: Family Medicine

## 2018-08-27 ENCOUNTER — Other Ambulatory Visit: Payer: Federal, State, Local not specified - PPO

## 2018-08-27 ENCOUNTER — Telehealth: Payer: Self-pay

## 2018-08-27 VITALS — Ht 69.0 in

## 2018-08-27 DIAGNOSIS — R6889 Other general symptoms and signs: Secondary | ICD-10-CM | POA: Diagnosis not present

## 2018-08-27 DIAGNOSIS — B349 Viral infection, unspecified: Secondary | ICD-10-CM

## 2018-08-27 DIAGNOSIS — Z20822 Contact with and (suspected) exposure to covid-19: Secondary | ICD-10-CM

## 2018-08-27 MED ORDER — PROMETHAZINE-DM 6.25-15 MG/5ML PO SYRP: 5.0000 mL | ORAL_SOLUTION | Freq: Four times a day (QID) | ORAL | 0 refills | Status: DC | PRN

## 2018-08-27 NOTE — Addendum Note (Signed)
Addended by: Wilford Corner on: 08/27/2018 03:03 PM   Modules accepted: Orders

## 2018-08-27 NOTE — Progress Notes (Signed)
Established Patient Office Visit  Subjective:  Patient ID: Wayne Castillo, male    DOB: [birth year]  Age: 38 y.o. MRN: [id number]  CC:  Chief Complaint  Patient presents with  . Cough    brown mucus, no fever    HPI Wayne Castillo presents for treatment and evaluation of a 4 to 5-day history of fatigue, malaise, chills, postnasal drip, cough, myalgias.  Patient denies fevers, shortness of breath, reactive airway disease or difficulty breathing.  He does not smoke.  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  He is currently on track for gastric banding and is currently working with a nutritionist.  Past Medical History:  Diagnosis Date  . Diabetes mellitus without complication (HCC)   . Gout     Past Surgical History:  Procedure Laterality Date  . EAR TUBE REMOVAL    . WISDOM TOOTH EXTRACTION      Family History  Problem Relation Age of Onset  . Amblyopia Neg Hx   . Blindness Neg Hx   . Cataracts Neg Hx   . Glaucoma Neg Hx   . Macular degeneration Neg Hx   . Retinal detachment Neg Hx   . Strabismus Neg Hx   . Retinitis pigmentosa Neg Hx     Social History   Socioeconomic History  . Marital status: Single    Spouse name: Not on file  . Number of children: Not on file  . Years of education: Not on file  . Highest education level: Not on file  Occupational History  . Not on file  Social Needs  . Financial resource strain: Not on file  . Food insecurity:    Worry: Not on file    Inability: Not on file  . Transportation needs:    Medical: Not on file    Non-medical: Not on file  Tobacco Use  . Smoking status: Never Smoker  . Smokeless tobacco: Never Used  Substance and Sexual Activity  . Alcohol use: No  . Drug use: No  . Sexual activity: Not on file  Lifestyle  . Physical activity:    Days per week: Not on file    Minutes per session: Not on file  . Stress: Not on file  Relationships  . Social connections:    Talks on phone: Not on file     Gets together: Not on file    Attends religious service: Not on file    Active member of club or organization: Not on file    Attends meetings of clubs or organizations: Not on file    Relationship status: Not on file  . Intimate partner violence:    Fear of current or ex partner: Not on file    Emotionally abused: Not on file    Physically abused: Not on file    Forced sexual activity: Not on file  Other Topics Concern  . Not on file  Social History Narrative  . Not on file    Outpatient Medications Prior to Visit  Medication Sig Dispense Refill  . allopurinol (ZYLOPRIM) 300 MG tablet TAKE 1 TABLET(300 MG) BY MOUTH DAILY 90 tablet 1  . metFORMIN (GLUCOPHAGE) 500 MG tablet TAKE 1 TABLET(500 MG) BY MOUTH TWICE DAILY WITH A MEAL 180 tablet 1  . olmesartan (BENICAR) 5 MG tablet TAKE 1 TABLET(5 MG) BY MOUTH DAILY 90 tablet 1   No facility-administered medications prior to visit.     No Known Allergies  ROS Review  of Systems  Constitutional: Positive for appetite change, chills and fatigue. Negative for diaphoresis, fever and unexpected weight change.  HENT: Positive for congestion, postnasal drip and sore throat. Negative for sinus pressure, sinus pain and trouble swallowing.   Eyes: Negative for photophobia and visual disturbance.  Respiratory: Positive for cough. Negative for chest tightness, shortness of breath and wheezing.   Cardiovascular: Negative.   Gastrointestinal: Positive for nausea. Negative for vomiting.  Genitourinary: Negative.   Musculoskeletal: Positive for myalgias. Negative for arthralgias.  Skin: Negative for pallor and wound.  Neurological: Positive for headaches.  Hematological: Does not bruise/bleed easily.  Psychiatric/Behavioral: Negative.       Objective:    Physical Exam  Constitutional: He is oriented to person, place, and time. He appears well-developed and well-nourished. No distress.  HENT:  Head: Normocephalic and atraumatic.  Right  Ear: External ear normal.  Left Ear: External ear normal.  Eyes: Conjunctivae are normal. Right eye exhibits no discharge. Left eye exhibits no discharge. No scleral icterus.  Neck: Neck supple. No JVD present. No tracheal deviation present.  Pulmonary/Chest: Effort normal. No stridor.  Neurological: He is alert and oriented to person, place, and time.  Skin: He is not diaphoretic.  Psychiatric: He has a normal mood and affect. His behavior is normal.    Ht 5\' 9"  (1.753 m)   BMI 47.40 kg/m  Wt Readings from Last 3 Encounters:  08/25/18 (!) 321 lb (145.6 kg)  01/13/18 (!) 314 lb 6 oz (142.6 kg)  10/10/17 (!) 305 lb 8 oz (138.6 kg)     Health Maintenance Due  Topic Date Due  . PNEUMOCOCCAL POLYSACCHARIDE VACCINE AGE 65-64 HIGH RISK  12/24/1981  . FOOT EXAM  12/24/1989  . HEMOGLOBIN A1C  07/15/2018    There are no preventive care reminders to display for this patient.  Lab Results  Component Value Date   TSH 0.61 01/13/2018   Lab Results  Component Value Date   WBC 6.3 01/13/2018   HGB 14.2 01/13/2018   HCT 42.4 01/13/2018   MCV 87.4 01/13/2018   PLT 242.0 01/13/2018   Lab Results  Component Value Date   NA 138 01/13/2018   K 4.3 01/13/2018   CO2 29 01/13/2018   GLUCOSE 129 (H) 01/13/2018   BUN 14 01/13/2018   CREATININE 0.81 01/13/2018   BILITOT 0.4 01/13/2018   ALKPHOS 75 01/13/2018   AST 26 01/13/2018   ALT 34 01/13/2018   PROT 7.1 01/13/2018   ALBUMIN 4.1 01/13/2018   CALCIUM 9.3 01/13/2018   GFR 113.32 01/13/2018   Lab Results  Component Value Date   CHOL 145 01/13/2018   Lab Results  Component Value Date   HDL 39.90 01/13/2018   Lab Results  Component Value Date   LDLCALC 75 01/13/2018   Lab Results  Component Value Date   TRIG 153.0 (H) 01/13/2018   Lab Results  Component Value Date   CHOLHDL 4 01/13/2018   Lab Results  Component Value Date   HGBA1C 6.4 01/13/2018      Assessment & Plan:   Problem List Items Addressed This Visit       Other   Acute viral syndrome - Primary   Relevant Medications   promethazine-dextromethorphan (PROMETHAZINE-DM) 6.25-15 MG/5ML syrup      Meds ordered this encounter  Medications  . promethazine-dextromethorphan (PROMETHAZINE-DM) 6.25-15 MG/5ML syrup    Sig: Take 5 mLs by mouth 4 (four) times daily as needed for cough.    Dispense:  118  mL    Refill:  0    Follow-up: Return in about 5 days (around 09/01/2018), or if symptoms worsen or fail to improve.    Mliss Sax, MDVirtual Visit via Video Note  I connected with Wayne Castillo on 08/27/18 at  2:30 PM EDT by a video enabled telemedicine application and verified that I am speaking with the correct person using two identifiers.  Location: Patient: home Provider:    I discussed the limitations of evaluation and management by telemedicine and the availability of in person appointments. The patient expressed understanding and agreed to proceed.  History of Present Illness:    Observations/Objective:   Assessment and Plan:   Follow Up Instructions:    I discussed the assessment and treatment plan with the patient. The patient was provided an opportunity to ask questions and all were answered. The patient agreed with the plan and demonstrated an understanding of the instructions.   The patient was advised to call back or seek an in-person evaluation if the symptoms worsen or if the condition fails to improve as anticipated.  I provided 15 minutes of non-face-to-face time during this encounter.   Have requested [facility] Ferron Covid 2 acute testing.  Patient will sheltering home.  He lives alone.  He will be wearing a mask when out in public.  Follow-up on Tuesday if not improved or sooner if worse.

## 2018-08-27 NOTE — Telephone Encounter (Signed)
Patient called and advised of the request from Dr. Doreene Burke to schedule a COVID test, appointment scheduled for today at 1545 at [facility], advised of location and to wear a mask.

## 2018-08-28 ENCOUNTER — Emergency Department (HOSPITAL_BASED_OUTPATIENT_CLINIC_OR_DEPARTMENT_OTHER)
Admission: EM | Admit: 2018-08-28 | Discharge: 2018-08-28 | Discharge status: Against Medical Advice | Payer: Federal, State, Local not specified - PPO

## 2018-08-28 ENCOUNTER — Other Ambulatory Visit: Payer: Self-pay

## 2018-08-28 NOTE — ED Triage Notes (Signed)
Pt states he was tested for covid but the results would take 3 days.  Pt requesting our rapid test for covid only.  Doesn't want ED visit.  Informed pt that we usually use the rapid test for admisssions.  Pt states he will just wait for the other test.

## 2018-08-30 LAB — NOVEL CORONAVIRUS, NAA: SARS-CoV-2, NAA: NOT DETECTED

## 2018-09-01 ENCOUNTER — Ambulatory Visit (INDEPENDENT_AMBULATORY_CARE_PROVIDER_SITE_OTHER): Payer: Federal, State, Local not specified - PPO | Admitting: Family Medicine

## 2018-09-01 ENCOUNTER — Encounter: Payer: Self-pay | Admitting: Family Medicine

## 2018-09-01 VITALS — Ht 69.0 in

## 2018-09-01 DIAGNOSIS — J22 Unspecified acute lower respiratory infection: Secondary | ICD-10-CM

## 2018-09-01 MED ORDER — CLARITHROMYCIN ER 500 MG PO TB24: 1000.0000 mg | ORAL_TABLET | Freq: Every day | ORAL | 0 refills | Status: AC

## 2018-09-01 NOTE — Progress Notes (Signed)
Established Patient Office Visit  Subjective:  Patient ID: Wayne Castillo, male    DOB: Oct 02, 1980  Age: 38 y.o. MRN: [id number]  CC:  Chief Complaint  Patient presents with  . Cough    not any better    HPI MUAD NOGA presents for follow-up of his lower respiratory tract infection.  Patient continues to improve but a cough persists.  Productive of brown phlegm.  Patient denies nasal congestion postnasal drip sinus pressure fever chills nausea and vomiting or reactive airway disease.  There is no shortness of breath or difficulty breathing.  COVID test was negative.  Past Medical History:  Diagnosis Date  . Diabetes mellitus without complication (HCC)   . Gout     Past Surgical History:  Procedure Laterality Date  . EAR TUBE REMOVAL    . WISDOM TOOTH EXTRACTION      Family History  Problem Relation Age of Onset  . Amblyopia Neg Hx   . Blindness Neg Hx   . Cataracts Neg Hx   . Glaucoma Neg Hx   . Macular degeneration Neg Hx   . Retinal detachment Neg Hx   . Strabismus Neg Hx   . Retinitis pigmentosa Neg Hx     Social History   Socioeconomic History  . Marital status: Single    Spouse name: Not on file  . Number of children: Not on file  . Years of education: Not on file  . Highest education level: Not on file  Occupational History  . Not on file  Social Needs  . Financial resource strain: Not on file  . Food insecurity:    Worry: Not on file    Inability: Not on file  . Transportation needs:    Medical: Not on file    Non-medical: Not on file  Tobacco Use  . Smoking status: Never Smoker  . Smokeless tobacco: Never Used  Substance and Sexual Activity  . Alcohol use: No  . Drug use: No  . Sexual activity: Not on file  Lifestyle  . Physical activity:    Days per week: Not on file    Minutes per session: Not on file  . Stress: Not on file  Relationships  . Social connections:    Talks on phone: Not on file    Gets together: Not on file     Attends religious service: Not on file    Active member of club or organization: Not on file    Attends meetings of clubs or organizations: Not on file    Relationship status: Not on file  . Intimate partner violence:    Fear of current or ex partner: Not on file    Emotionally abused: Not on file    Physically abused: Not on file    Forced sexual activity: Not on file  Other Topics Concern  . Not on file  Social History Narrative  . Not on file    Outpatient Medications Prior to Visit  Medication Sig Dispense Refill  . allopurinol (ZYLOPRIM) 300 MG tablet TAKE 1 TABLET(300 MG) BY MOUTH DAILY 90 tablet 1  . metFORMIN (GLUCOPHAGE) 500 MG tablet TAKE 1 TABLET(500 MG) BY MOUTH TWICE DAILY WITH A MEAL 180 tablet 1  . olmesartan (BENICAR) 5 MG tablet TAKE 1 TABLET(5 MG) BY MOUTH DAILY 90 tablet 1  . promethazine-dextromethorphan (PROMETHAZINE-DM) 6.25-15 MG/5ML syrup Take 5 mLs by mouth 4 (four) times daily as needed for cough. 118 mL 0   No facility-administered medications  prior to visit.     No Known Allergies  ROS Review of Systems  Constitutional: Negative for chills, diaphoresis, fatigue, fever and unexpected weight change.  HENT: Negative for congestion, postnasal drip, rhinorrhea, sinus pressure and sinus pain.   Eyes: Negative for photophobia and visual disturbance.  Respiratory: Positive for cough. Negative for shortness of breath and wheezing.   Cardiovascular: Negative.   Gastrointestinal: Negative.   Musculoskeletal: Negative for arthralgias and myalgias.  Neurological: Negative for weakness.  Psychiatric/Behavioral: Negative.       Objective:    Physical Exam  Constitutional: He is oriented to person, place, and time. He appears well-developed and well-nourished. No distress.  HENT:  Head: Normocephalic and atraumatic.  Right Ear: External ear normal.  Left Ear: External ear normal.  Eyes: Right eye exhibits no discharge. Left eye exhibits no discharge. No  scleral icterus.  Neck: No tracheal deviation present.  Pulmonary/Chest: Effort normal. No stridor.  Neurological: He is alert and oriented to person, place, and time.  Skin: He is not diaphoretic.  Psychiatric: He has a normal mood and affect. His behavior is normal.    Ht 5\' 9"  (1.753 m)   BMI 47.26 kg/m  Wt Readings from Last 3 Encounters:  08/28/18 (!) 320 lb (145.2 kg)  08/25/18 (!) 321 lb (145.6 kg)  01/13/18 (!) 314 lb 6 oz (142.6 kg)     Health Maintenance Due  Topic Date Due  . PNEUMOCOCCAL POLYSACCHARIDE VACCINE AGE 37-64 HIGH RISK  12/24/1981  . FOOT EXAM  12/24/1989  . HEMOGLOBIN A1C  07/15/2018    There are no preventive care reminders to display for this patient.  Lab Results  Component Value Date   TSH 0.61 01/13/2018   Lab Results  Component Value Date   WBC 6.3 01/13/2018   HGB 14.2 01/13/2018   HCT 42.4 01/13/2018   MCV 87.4 01/13/2018   PLT 242.0 01/13/2018   Lab Results  Component Value Date   NA 138 01/13/2018   K 4.3 01/13/2018   CO2 29 01/13/2018   GLUCOSE 129 (H) 01/13/2018   BUN 14 01/13/2018   CREATININE 0.81 01/13/2018   BILITOT 0.4 01/13/2018   ALKPHOS 75 01/13/2018   AST 26 01/13/2018   ALT 34 01/13/2018   PROT 7.1 01/13/2018   ALBUMIN 4.1 01/13/2018   CALCIUM 9.3 01/13/2018   GFR 113.32 01/13/2018   Lab Results  Component Value Date   CHOL 145 01/13/2018   Lab Results  Component Value Date   HDL 39.90 01/13/2018   Lab Results  Component Value Date   LDLCALC 75 01/13/2018   Lab Results  Component Value Date   TRIG 153.0 (H) 01/13/2018   Lab Results  Component Value Date   CHOLHDL 4 01/13/2018   Lab Results  Component Value Date   HGBA1C 6.4 01/13/2018      Assessment & Plan:   Problem List Items Addressed This Visit    None    Visit Diagnoses    Lower respiratory infection    -  Primary   Relevant Medications   clarithromycin (BIAXIN XL) 500 MG 24 hr tablet      Meds ordered this encounter   Medications  . clarithromycin (BIAXIN XL) 500 MG 24 hr tablet    Sig: Take 2 tablets (1,000 mg total) by mouth daily for 10 days.    Dispense:  20 tablet    Refill:  0    Follow-up: No follow-ups on file.    Chrissie NoaWilliam  Rosana Fret, MDVirtual Visit via Video Note  I connected with Virgina Jock on 09/01/18 at  2:30 PM EDT by a video enabled telemedicine application and verified that I am speaking with the correct person using two identifiers.  Location: Patient: home Provider:    I discussed the limitations of evaluation and management by telemedicine and the availability of in person appointments. The patient expressed understanding and agreed to proceed.  History of Present Illness:    Observations/Objective:   Assessment and Plan:   Follow Up Instructions:    I discussed the assessment and treatment plan with the patient. The patient was provided an opportunity to ask questions and all were answered. The patient agreed with the plan and demonstrated an understanding of the instructions.   The patient was advised to call back or seek an in-person evaluation if the symptoms worsen or if the condition fails to improve as anticipated.  I provided 15 minutes of non-face-to-face time during this encounter.    We will start Biaxin and treat for 10 days.  Patient has a scheduled appointment to see me on June 2.

## 2018-09-08 ENCOUNTER — Encounter: Payer: Self-pay | Admitting: Family Medicine

## 2018-09-08 ENCOUNTER — Telehealth: Payer: Self-pay

## 2018-09-08 ENCOUNTER — Ambulatory Visit (INDEPENDENT_AMBULATORY_CARE_PROVIDER_SITE_OTHER): Payer: Federal, State, Local not specified - PPO | Admitting: Family Medicine

## 2018-09-08 VITALS — BP 130/80 | HR 72 | Temp 98.0°F | Ht 69.0 in | Wt 316.1 lb

## 2018-09-08 DIAGNOSIS — M109 Gout, unspecified: Secondary | ICD-10-CM

## 2018-09-08 DIAGNOSIS — Z0001 Encounter for general adult medical examination with abnormal findings: Secondary | ICD-10-CM

## 2018-09-08 DIAGNOSIS — E559 Vitamin D deficiency, unspecified: Secondary | ICD-10-CM

## 2018-09-08 DIAGNOSIS — E119 Type 2 diabetes mellitus without complications: Secondary | ICD-10-CM | POA: Diagnosis not present

## 2018-09-08 DIAGNOSIS — K080 Exfoliation of teeth due to systemic causes: Secondary | ICD-10-CM | POA: Diagnosis not present

## 2018-09-08 DIAGNOSIS — Z6841 Body Mass Index (BMI) 40.0 and over, adult: Secondary | ICD-10-CM

## 2018-09-08 LAB — MICROALBUMIN / CREATININE URINE RATIO
Creatinine,U: 363.9 mg/dL
Microalb Creat Ratio: 2.2 mg/g (ref 0.0–30.0)
Microalb, Ur: 8.2 mg/dL — ABNORMAL HIGH (ref 0.0–1.9)

## 2018-09-08 LAB — VITAMIN D 25 HYDROXY (VIT D DEFICIENCY, FRACTURES): VITD: 22.61 ng/mL — ABNORMAL LOW (ref 30.00–100.00)

## 2018-09-08 LAB — LIPID PANEL
Cholesterol: 149 mg/dL (ref 0–200)
HDL: 37.2 mg/dL — ABNORMAL LOW (ref >39.00)
LDL Cholesterol: 80 mg/dL (ref 0–99)
NonHDL: 112.13
Total CHOL/HDL Ratio: 4
Triglycerides: 161 mg/dL — ABNORMAL HIGH (ref 0.0–149.0)
VLDL: 32.2 mg/dL (ref 0.0–40.0)

## 2018-09-08 LAB — URINALYSIS, ROUTINE W REFLEX MICROSCOPIC
Bilirubin Urine: NEGATIVE
Ketones, ur: NEGATIVE
Leukocytes,Ua: NEGATIVE
Nitrite: NEGATIVE
Specific Gravity, Urine: >=1.03 — AB (ref 1.000–1.030)
Total Protein, Urine: 30 — AB
Urine Glucose: NEGATIVE
Urobilinogen, UA: 0.2 (ref 0.0–1.0)
pH: 5.5 (ref 5.0–8.0)

## 2018-09-08 LAB — COMPREHENSIVE METABOLIC PANEL
ALT: 36 U/L (ref 0–53)
AST: 21 U/L (ref 0–37)
Albumin: 4 g/dL (ref 3.5–5.2)
Alkaline Phosphatase: 73 U/L (ref 39–117)
BUN: 13 mg/dL (ref 6–23)
CO2: 30 mEq/L (ref 19–32)
Calcium: 9 mg/dL (ref 8.4–10.5)
Chloride: 101 mEq/L (ref 96–112)
Creatinine, Ser: 0.84 mg/dL (ref 0.40–1.50)
GFR: 101.88 mL/min (ref >60.00)
Glucose, Bld: 158 mg/dL — ABNORMAL HIGH (ref 70–99)
Potassium: 3.9 mEq/L (ref 3.5–5.1)
Sodium: 138 mEq/L (ref 135–145)
Total Bilirubin: 0.5 mg/dL (ref 0.2–1.2)
Total Protein: 6.9 g/dL (ref 6.0–8.3)

## 2018-09-08 LAB — HEMOGLOBIN A1C: Hgb A1c MFr Bld: 8.2 % — ABNORMAL HIGH (ref 4.6–6.5)

## 2018-09-08 LAB — URIC ACID: Uric Acid, Serum: 5.6 mg/dL (ref 4.0–7.8)

## 2018-09-08 MED ORDER — METFORMIN HCL 1000 MG PO TABS: 1000.0000 mg | ORAL_TABLET | Freq: Two times a day (BID) | ORAL | 1 refills | Status: DC

## 2018-09-08 MED ORDER — VITAMIN D (ERGOCALCIFEROL) 1.25 MG (50000 UNIT) PO CAPS: 50000.0000 [IU] | ORAL_CAPSULE | ORAL | 5 refills | Status: DC

## 2018-09-08 NOTE — Patient Instructions (Signed)

## 2018-09-08 NOTE — Addendum Note (Signed)
Addended by: Andrez Grime on: 09/08/2018 02:50 PM   Modules accepted: Orders

## 2018-09-08 NOTE — Telephone Encounter (Signed)
Questions for Screening COVID-19  Symptom onset: N/A  Travel or Contacts: N/A  During this illness, did/does the patient experience any of the following symptoms? Fever >100.79F []   Yes [x]   No []   Unknown Subjective fever (felt feverish) []   Yes [x]   No []   Unknown Chills []   Yes [x]   No []   Unknown Muscle aches (myalgia) []   Yes [x]   No []   Unknown Runny nose (rhinorrhea) []   Yes [x]   No []   Unknown Sore throat []   Yes [x]   No []   Unknown Cough (new onset or worsening of chronic cough) []   Yes [x]   No []   Unknown Shortness of breath (dyspnea) []   Yes [x]   No []   Unknown Nausea or vomiting []   Yes [x]   No []   Unknown Headache []   Yes [x]   No []   Unknown Abdominal pain  []   Yes [x]   No []   Unknown Diarrhea (?3 loose/looser than normal stools/24hr period) []   Yes [x]   No []   Unknown Other, specify:  Patient risk factors: Smoker? []   Current []   Former []   Never If male, currently pregnant? []   Yes []   No  Patient Active Problem List   Diagnosis Date Noted  . Acute viral syndrome 08/27/2018  . Encounter for health maintenance examination with abnormal findings 01/13/2018  . Need for influenza vaccination 01/13/2018  . Gout 01/13/2018  . Controlled type 2 diabetes mellitus without complication, without long-term current use of insulin (HCC) 07/08/2017  . Obesity 06/17/2017    Plan:  []   High risk for COVID-19 with red flags go to ED (with CP, SOB, weak/lightheaded, or fever > 101.5). Call ahead.  []   High risk for COVID-19 but stable. Inform provider and coordinate time for [facility] visit.   []   No red flags but URI signs or symptoms okay for [facility] visit.

## 2018-09-08 NOTE — Progress Notes (Addendum)
Established Patient Office Visit  Subjective:  Patient ID: Wayne Castillo, male    DOB: October 13, 1980  Age: 38 y.o. MRN: [id number]  CC:  Chief Complaint  Patient presents with  . Annual Exam    HPI Wayne Castillo presents for a complete physical.  Cough is improving and he is tolerating the Biaxin.  He is no longer febrile and there is minimal phlegm production.  Diabetes has been under control is tolerating the low-dose olmesartan for renal protection and metformin 500 twice daily.  He is no longer having gouty attacks while taking allopurinol his uric acid is well controlled.  He is currently in the process for having gastric sleeve surgery that hopefully will happen by November for him.  Continues to exercise by walking.  He does not smoke drink alcohol or use illicit drugs.  Past Medical History:  Diagnosis Date  . Diabetes mellitus without complication (HCC)   . Gout     Past Surgical History:  Procedure Laterality Date  . EAR TUBE REMOVAL    . WISDOM TOOTH EXTRACTION      Family History  Problem Relation Age of Onset  . Amblyopia Neg Hx   . Blindness Neg Hx   . Cataracts Neg Hx   . Glaucoma Neg Hx   . Macular degeneration Neg Hx   . Retinal detachment Neg Hx   . Strabismus Neg Hx   . Retinitis pigmentosa Neg Hx     Social History   Socioeconomic History  . Marital status: Single    Spouse name: Not on file  . Number of children: Not on file  . Years of education: Not on file  . Highest education level: Not on file  Occupational History  . Not on file  Social Needs  . Financial resource strain: Not on file  . Food insecurity:    Worry: Not on file    Inability: Not on file  . Transportation needs:    Medical: Not on file    Non-medical: Not on file  Tobacco Use  . Smoking status: Never Smoker  . Smokeless tobacco: Never Used  Substance and Sexual Activity  . Alcohol use: No  . Drug use: No  . Sexual activity: Not on file  Lifestyle  . Physical  activity:    Days per week: Not on file    Minutes per session: Not on file  . Stress: Not on file  Relationships  . Social connections:    Talks on phone: Not on file    Gets together: Not on file    Attends religious service: Not on file    Active member of club or organization: Not on file    Attends meetings of clubs or organizations: Not on file    Relationship status: Not on file  . Intimate partner violence:    Fear of current or ex partner: Not on file    Emotionally abused: Not on file    Physically abused: Not on file    Forced sexual activity: Not on file  Other Topics Concern  . Not on file  Social History Narrative  . Not on file    Outpatient Medications Prior to Visit  Medication Sig Dispense Refill  . allopurinol (ZYLOPRIM) 300 MG tablet TAKE 1 TABLET(300 MG) BY MOUTH DAILY 90 tablet 1  . clarithromycin (BIAXIN XL) 500 MG 24 hr tablet Take 2 tablets (1,000 mg total) by mouth daily for 10 days. 20 tablet 0  .  olmesartan (BENICAR) 5 MG tablet TAKE 1 TABLET(5 MG) BY MOUTH DAILY 90 tablet 1  . promethazine-dextromethorphan (PROMETHAZINE-DM) 6.25-15 MG/5ML syrup Take 5 mLs by mouth 4 (four) times daily as needed for cough. 118 mL 0  . metFORMIN (GLUCOPHAGE) 500 MG tablet TAKE 1 TABLET(500 MG) BY MOUTH TWICE DAILY WITH A MEAL 180 tablet 1   No facility-administered medications prior to visit.     No Known Allergies  ROS Review of Systems  Constitutional: Negative for diaphoresis, fatigue, fever and unexpected weight change.  HENT: Negative.  Negative for sinus pressure, sinus pain and sore throat.   Eyes: Negative for photophobia and visual disturbance.  Respiratory: Positive for cough. Negative for shortness of breath and wheezing.   Cardiovascular: Negative.   Gastrointestinal: Negative.   Endocrine: Negative for polyphagia and polyuria.  Genitourinary: Negative.   Musculoskeletal: Negative for gait problem and joint swelling.  Skin: Negative for pallor and  rash.  Allergic/Immunologic: Negative for immunocompromised state.  Neurological: Negative for weakness and headaches.  Hematological: Does not bruise/bleed easily.  Psychiatric/Behavioral: Negative.       Objective:    Physical Exam  Constitutional: He is oriented to person, place, and time. He appears well-developed and well-nourished. No distress.  HENT:  Head: Normocephalic and atraumatic.  Right Ear: External ear normal.  Left Ear: External ear normal.  Mouth/Throat: Oropharynx is clear and moist. No oropharyngeal exudate.  Eyes: Pupils are equal, round, and reactive to light. Conjunctivae are normal. Right eye exhibits no discharge. Left eye exhibits no discharge. No scleral icterus.  Neck: Neck supple. No JVD present. No tracheal deviation present. No thyromegaly present.  Cardiovascular: Normal rate, regular rhythm and normal heart sounds.  Pulmonary/Chest: Effort normal and breath sounds normal. No stridor.  Abdominal: Soft. Bowel sounds are normal. He exhibits no distension. There is no abdominal tenderness. There is no rebound and no guarding. Hernia confirmed negative in the right inguinal area and confirmed negative in the left inguinal area.  Genitourinary:    Penis normal.  Right testis shows no mass, no swelling and no tenderness. Right testis is descended. Left testis shows no mass, no swelling and no tenderness. Left testis is descended. No hypospadias, penile erythema or penile tenderness. No discharge found.  Lymphadenopathy:    He has no cervical adenopathy.       Right: No inguinal adenopathy present.       Left: No inguinal adenopathy present.  Neurological: He is alert and oriented to person, place, and time.  Skin: Skin is warm and dry. No rash noted. He is not diaphoretic.  Psychiatric: He has a normal mood and affect. His behavior is normal.   Diabetic Foot Exam - Simple   Simple Foot Form  Visual Inspection  See comments: Yes  Sensation Testing   Intact to touch and monofilament testing bilaterally: Yes  Pulse Check  Posterior Tibialis and Dorsalis pulse intact bilaterally: Yes  Comments  Pes planus.  BP 130/80   Pulse 72   Temp 98 F (36.7 C) (Oral)   Ht  (1.753 m)   Wt (!) 316 lb 2 oz (143.4 kg)   SpO2 96%   BMI 46.68 kg/m  Wt Readings from Last 3 Encounters:  09/08/18 (!) 316 lb 2 oz (143.4 kg)  08/28/18 (!) 320 lb (145.2 kg)  08/25/18 (!) 321 lb (145.6 kg)   BP Readings from Last 3 Encounters:  09/08/18 130/80  08/28/18 117/80  01/13/18 126/80   Guideline developer:  UpToDate (  see UpToDate for funding source) Date Released: June 2014  Health Maintenance Due  Topic Date Due  . PNEUMOCOCCAL POLYSACCHARIDE VACCINE AGE 48-64 HIGH RISK  12/24/1981  . FOOT EXAM  12/24/1989  . HEMOGLOBIN A1C  07/15/2018    There are no preventive care reminders to display for this patient.  Lab Results  Component Value Date   TSH 0.61 01/13/2018   Lab Results  Component Value Date   WBC 6.3 01/13/2018   HGB 14.2 01/13/2018   HCT 42.4 01/13/2018   MCV 87.4 01/13/2018   PLT 242.0 01/13/2018   Lab Results  Component Value Date   NA 138 09/08/2018   K 3.9 09/08/2018   CO2 30 09/08/2018   GLUCOSE 158 (H) 09/08/2018   BUN 13 09/08/2018   CREATININE 0.84 09/08/2018   BILITOT 0.5 09/08/2018   ALKPHOS 73 09/08/2018   AST 21 09/08/2018   ALT 36 09/08/2018   PROT 6.9 09/08/2018   ALBUMIN 4.0 09/08/2018   CALCIUM 9.0 09/08/2018   GFR 101.88 09/08/2018   Lab Results  Component Value Date   CHOL 149 09/08/2018   Lab Results  Component Value Date   HDL 37.20 (L) 09/08/2018   Lab Results  Component Value Date   LDLCALC 80 09/08/2018   Lab Results  Component Value Date   TRIG 161.0 (H) 09/08/2018   Lab Results  Component Value Date   CHOLHDL 4 09/08/2018   Lab Results  Component Value Date   HGBA1C 8.2 (H) 09/08/2018      Assessment & Plan:   Problem List Items Addressed This Visit      Endocrine    Controlled type 2 diabetes mellitus without complication, without long-term current use of insulin (HCC) - Primary   Relevant Medications   metFORMIN (GLUCOPHAGE) 1000 MG tablet   Other Relevant Orders   Comprehensive metabolic panel (Completed)   Hemoglobin A1c (Completed)   Lipid panel (Completed)   Urinalysis, Routine w reflex microscopic (Completed)   Microalbumin / creatinine urine ratio (Completed)     Other   Obesity   Relevant Medications   metFORMIN (GLUCOPHAGE) 1000 MG tablet   Other Relevant Orders   VITAMIN D 25 Hydroxy (Vit-D Deficiency, Fractures) (Completed)   Encounter for health maintenance examination with abnormal findings   Relevant Orders   Comprehensive metabolic panel (Completed)   Lipid panel (Completed)   Gout   Relevant Orders   Comprehensive metabolic panel (Completed)   Uric acid (Completed)    Other Visit Diagnoses    Vitamin D deficiency       Relevant Medications   Vitamin D, Ergocalciferol, (DRISDOL) 1.25 MG (50000 UT) CAPS capsule      Meds ordered this encounter  Medications  . metFORMIN (GLUCOPHAGE) 1000 MG tablet    Sig: Take 1 tablet (1,000 mg total) by mouth 2 (two) times daily with a meal.    Dispense:  180 tablet    Refill:  1  . Vitamin D, Ergocalciferol, (DRISDOL) 1.25 MG (50000 UT) CAPS capsule    Sig: Take 1 capsule (50,000 Units total) by mouth every 7 (seven) days.    Dispense:  5 capsule    Refill:  5    Follow-up: Return in about 6 months (around 03/10/2019).    Patient was given information on health maintenance and disease prevention.  Encouraged him to persist on his attainment of a gastric sleeve.  I believe this could make a major difference in his life.  Follow-up in 3  months or as needed as needed.   Diabetes is under worse control.  Hemoglobin A1c is increased to 8.3 from 6.4.  Patient is vitamin D deficient..  Increased metformin to thousand milligrams twice daily and added high-dose vitamin D weekly.   Follow-up in 3 months.

## 2018-09-16 ENCOUNTER — Other Ambulatory Visit: Payer: Self-pay | Admitting: Family Medicine

## 2018-09-16 DIAGNOSIS — M109 Gout, unspecified: Secondary | ICD-10-CM

## 2018-09-18 ENCOUNTER — Other Ambulatory Visit: Payer: Self-pay

## 2018-09-18 ENCOUNTER — Ambulatory Visit (HOSPITAL_COMMUNITY)
Admission: RE | Admit: 2018-09-18 | Discharge: 2018-09-18 | Disposition: A | Discharge status: Home / Self Care | Payer: Federal, State, Local not specified - PPO | Source: Ambulatory Visit | Attending: Surgery | Admitting: Surgery

## 2018-09-18 DIAGNOSIS — Z01818 Encounter for other preprocedural examination: Secondary | ICD-10-CM | POA: Diagnosis not present

## 2018-09-19 ENCOUNTER — Other Ambulatory Visit: Payer: Self-pay | Admitting: Family Medicine

## 2018-09-19 DIAGNOSIS — E119 Type 2 diabetes mellitus without complications: Secondary | ICD-10-CM

## 2018-09-24 ENCOUNTER — Other Ambulatory Visit: Payer: Self-pay

## 2018-09-24 ENCOUNTER — Encounter: Payer: Federal, State, Local not specified - PPO | Attending: Surgery | Admitting: Dietician

## 2018-09-24 DIAGNOSIS — E669 Obesity, unspecified: Secondary | ICD-10-CM | POA: Diagnosis not present

## 2018-09-24 NOTE — Patient Instructions (Signed)
Continue working through the [employer] discussed today, especially focusing on:  - Not drinking fluids with food (meals and snacks) - Remember to [place] thoroughly! - Watch portion sizes

## 2018-09-24 NOTE — Progress Notes (Signed)
Bariatric Supervised Weight Loss Visit Appt Start Time: 9:05am  End Time: 9:20am  Planned Surgery: Sleeve Gastrectomy    1st out of 3 [facility] Appointments   NUTRITION ASSESSMENT  Anthropometrics  Start weight at [facility]: 321 lbs (date: 08/25/2018) Today's weight: 315 lbs Weight change: -6 lbs (since previous visit on 08/25/2018) BMI: 46.52 kg/m2    Clinical  Medical Hx: obesity, T2DM (does not check BG) Medications: allopurinol, metformin, olmesartan medoxomil   Psychosocial/Lifestyle Pt works as a Freight forwarder, lives alone. Parents divorced when he was about 47 years old, which is when weight gain began. 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 States he noticed a coworker had lost a lot of weight and found out it was related to bariatric surgery, pt also states his mom has suggested surgery for a while, which both increased his interest in surgery.  24-Hr Dietary Recall First Meal: apple (or pear)  Snack: none Second Meal: sandwich + cheese stick + banana  Snack: none Third Meal: stir fry chicken + vegetables Snack: none Beverages: Crystal Light + water + soda (occasionaly)   Food & Nutrition Related Hx Dietary Hx: Still packs lunch for work. States he was sick for about 2 weeks, during which time he did not eat a lot and likely why he dropped 6 pounds since last visit.  Pt does not provide descriptive food intake history or much info in general, making it difficult to get an accurate picture of current dietary habits and overall intake. 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   NUTRITION INTERVENTION  Nutrition counseling (C-1) and education (E-2) to facilitate bariatric surgery goals.  Pre-Op Goals Progress & New Goals . Did not work on not drinking with meals  . Working on avoiding sugar-sweetened beverages . Does not drink alcohol . Meeting fluid goal  . NEW: Practice CHEWING food thoroughly  . NEW: Monitor portion sizes   Learning Style & Readiness for Change Teaching method utilized: Visual & Auditory  Demonstrated degree of understanding via: Teach Back  Barriers to learning/adherence to lifestyle change: Contemplative Stage of Change. Pt does not seem motivated to set goals or make changes prior to surgery. Pt states portion control is an issue for him, and I provided extensive education on ways to help with this. However, at the end of the appointment pt states he isn't willing to work on it now but will rather address it after surgery when he is "forced" to eat smaller portions.   RD's Notes for next Visit  . Balanced breakfasts and snacks (add in protein)   MONITORING & EVALUATION Dietary intake, weekly physical activity, body weight, and pre-op goals in 1 month.   Next Steps  Patient is to return to [facility] in 1 month for 2nd supervised weight loss visit.

## 2018-10-29 ENCOUNTER — Other Ambulatory Visit: Payer: Self-pay

## 2018-10-29 ENCOUNTER — Encounter: Payer: Self-pay | Admitting: Dietician

## 2018-10-29 ENCOUNTER — Encounter: Payer: Federal, State, Local not specified - PPO | Attending: Surgery | Admitting: Dietician

## 2018-10-29 DIAGNOSIS — E669 Obesity, unspecified: Secondary | ICD-10-CM | POA: Insufficient documentation

## 2018-10-29 NOTE — Progress Notes (Signed)
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 States he noticed a coworker had lost a lot of weight and found out it was related to bariatric surgery, pt also states his mom has suggested surgery for a while, which both increased his interest in surgery.  24-Hr Dietary Recall First Meal: apple (or pear)  Snack: peanuts Second Meal: sandwich + cheese stick + banana + pineapple + cantaloupe  Snack: chips Third Meal: spaghetti (or homemade meal)  Snack: none Beverages: Crystal Light + water + soda (occasionaly)   Food & Nutrition Related Hx Dietary Hx: Pt states his daily intake is typically the same in the morning (fruit for breakfast, nuts for a snack, then packs the same lunch daily and has chips in the afternoon) then will make something for dinner at home. Usually will make a lot of the recipe so that it lasts for a full week of dinners. Dinner may be pasta dish or some other homemade meal.  States he has been working on several Pre-Op Goals, especially chewing and not drinking with meals.  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   NUTRITION INTERVENTION  Nutrition counseling (C-1) and education (E-2) to facilitate bariatric surgery goals.  Pre-Op Goals Progress & New Goals . Working on not drinking with meals, improving!  . Working on avoiding sugar-sweetened beverages . Does not drink alcohol . Meeting fluid goal  . Chewing food thoroughly, and in turn eating more slowly and recognizing satisfaction before getting too full  . Monitoring portion sizes   Learning Style & Readiness for Change Teaching method utilized: Visual & Auditory  Demonstrated degree of understanding via: Teach Back  Barriers to learning/adherence to lifestyle change: Contemplative Stage of Change. Pt has greatly improved in making effort on working towards lifestyle changes that will help with the surgery process.    RD's Notes for next Visit  . Balanced breakfasts and snacks (add in protein)   MONITORING & EVALUATION Dietary intake, weekly physical activity, body weight, and pre-op goals in 1 month.   Next Steps  Patient is to return to [facility] in 1 month for 3rd supervised weight loss visit.

## 2018-11-25 ENCOUNTER — Other Ambulatory Visit: Payer: Self-pay

## 2018-11-25 ENCOUNTER — Encounter: Payer: Federal, State, Local not specified - PPO | Attending: Surgery | Admitting: Dietician

## 2018-11-25 ENCOUNTER — Encounter: Payer: Self-pay | Admitting: Dietician

## 2018-11-25 DIAGNOSIS — E669 Obesity, unspecified: Secondary | ICD-10-CM | POA: Diagnosis not present

## 2018-11-25 NOTE — Progress Notes (Signed)
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 States he noticed a coworker had lost a lot of weight and found out it was related to bariatric surgery, pt also states his mom has suggested surgery for a while, which both increased his interest in surgery.  24-Hr Dietary Recall First Meal: apple (or pear)  Snack: peanuts Second Meal: sandwich + cheese stick + banana + pineapple + cantaloupe  Snack: chips Third Meal: spaghetti (or homemade meal)  Snack: none Beverages: Crystal Light + water + soda (occasionaly)   Food & Nutrition Related Hx Dietary Hx: Pt states his daily intake is typically the same in the morning (fruit for breakfast, nuts for a snack, then packs the same lunch daily and has chips in the afternoon) then will make something for dinner at home. Usually will make a lot of the recipe so that it lasts for a full week of dinners. Dinner may be pasta dish or some other homemade meal.  States he has been working on several Pre-Op Goals, especially chewing and not drinking with meals.  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   NUTRITION INTERVENTION  Nutrition counseling (C-1) and education (E-2) to facilitate bariatric surgery goals.  Pre-Op Goals Progress & New Goals . Working on not drinking with meals, improving!  . Working on avoiding sugar-sweetened beverages . Does not drink alcohol . Meeting fluid goal  . Chewing food thoroughly, and in turn eating more slowly and recognizing satisfaction before getting too full  . Monitoring portion sizes   Learning Style & Readiness for Change Teaching method utilized: Visual & Auditory  Demonstrated degree of understanding via: Teach Back  Barriers to learning/adherence to lifestyle change: Contemplative Stage of Change. Pt has greatly improved in making effort on working towards lifestyle changes that will help with the surgery process.     MONITORING & EVALUATION Dietary intake, weekly physical activity, body weight, and pre-op goals at Pre-Op Class.   Next Steps  Patient is to contact [facility] to schedule Pre-Op Class (2 weeks prior) and Post-Op Class (2 weeks after) based on when surgery date is determined. Patient states he anticipates having surgery sometime this November.

## 2018-11-25 NOTE — Patient Instructions (Signed)
See you at Pre-Op Class about 2 weeks before surgery!

## 2019-03-01 ENCOUNTER — Other Ambulatory Visit: Payer: Self-pay

## 2019-03-01 DIAGNOSIS — E119 Type 2 diabetes mellitus without complications: Secondary | ICD-10-CM

## 2019-03-01 MED ORDER — METFORMIN HCL 1000 MG PO TABS: 1000.0000 mg | ORAL_TABLET | Freq: Two times a day (BID) | ORAL | 0 refills | Status: DC

## 2019-03-10 ENCOUNTER — Other Ambulatory Visit: Payer: Self-pay | Admitting: Family Medicine

## 2019-03-10 DIAGNOSIS — M109 Gout, unspecified: Secondary | ICD-10-CM

## 2019-03-10 DIAGNOSIS — E119 Type 2 diabetes mellitus without complications: Secondary | ICD-10-CM

## 2019-04-05 ENCOUNTER — Other Ambulatory Visit: Payer: Self-pay | Admitting: Family Medicine

## 2019-04-05 DIAGNOSIS — E559 Vitamin D deficiency, unspecified: Secondary | ICD-10-CM

## 2019-05-19 DIAGNOSIS — F5089 Other specified eating disorder: Secondary | ICD-10-CM | POA: Diagnosis not present

## 2019-05-21 DIAGNOSIS — F5089 Other specified eating disorder: Secondary | ICD-10-CM | POA: Diagnosis not present

## 2019-05-25 ENCOUNTER — Other Ambulatory Visit: Payer: Self-pay | Admitting: Family Medicine

## 2019-05-25 DIAGNOSIS — E119 Type 2 diabetes mellitus without complications: Secondary | ICD-10-CM

## 2019-05-26 NOTE — Telephone Encounter (Signed)
Appointment scheduled.

## 2019-06-07 ENCOUNTER — Other Ambulatory Visit: Payer: Self-pay

## 2019-06-07 ENCOUNTER — Encounter: Payer: Federal, State, Local not specified - PPO | Attending: Surgery | Admitting: Skilled Nursing Facility1

## 2019-06-07 DIAGNOSIS — E669 Obesity, unspecified: Secondary | ICD-10-CM | POA: Insufficient documentation

## 2019-06-07 NOTE — Progress Notes (Signed)
Pre-Operative Nutrition Class:  Appt start time: 2924   End time:  1830.  Patient was seen on 06/07/2019 for Pre-Operative Bariatric Surgery Education at [facility].   Surgery date:  Surgery type: sleeve Start weight at [facility]: 321 Weight today: 315.3  Samples given per MNT protocol. Patient educated on appropriate usage: Bariatric Advantage Multivitamin Lot # [id number] Exp:08/21  Bariatric Advantage Calcium  Lot # [id number] Exp: 04/10/2020   Protein Shake Lot # [id number] Exp: 08/23/2020 The following the learning objectives were met by the patient during this course:  Identify Pre-Op Dietary Goals and will begin 2 weeks pre-operatively  Identify appropriate sources of fluids and proteins   State protein recommendations and appropriate sources pre and post-operatively  Identify Post-Operative Dietary Goals and will follow for 2 weeks post-operatively  Identify appropriate multivitamin and calcium sources  Describe the need for physical activity post-operatively and will follow MD recommendations  State when to call healthcare provider regarding medication questions or post-operative complications  Handouts given during class include:  Pre-Op Bariatric Surgery Diet Handout  Protein Shake Handout  Post-Op Bariatric Surgery Nutrition Handout  [facility] Program Information Flyer  Support Group Information Flyer  [facility] Bariatric Supplements Price List  Follow-Up Plan: Patient will follow-up at [facility] 2 weeks post operatively for diet advancement per MD.

## 2019-06-10 ENCOUNTER — Other Ambulatory Visit: Payer: Self-pay

## 2019-06-11 ENCOUNTER — Ambulatory Visit (INDEPENDENT_AMBULATORY_CARE_PROVIDER_SITE_OTHER): Payer: Federal, State, Local not specified - PPO | Admitting: Family Medicine

## 2019-06-11 ENCOUNTER — Encounter: Payer: Self-pay | Admitting: Family Medicine

## 2019-06-11 VITALS — BP 122/70 | HR 75 | Temp 97.4°F | Ht 69.0 in | Wt 314.2 lb

## 2019-06-11 DIAGNOSIS — Z6841 Body Mass Index (BMI) 40.0 and over, adult: Secondary | ICD-10-CM

## 2019-06-11 DIAGNOSIS — M109 Gout, unspecified: Secondary | ICD-10-CM

## 2019-06-11 DIAGNOSIS — E119 Type 2 diabetes mellitus without complications: Secondary | ICD-10-CM | POA: Diagnosis not present

## 2019-06-11 DIAGNOSIS — E559 Vitamin D deficiency, unspecified: Secondary | ICD-10-CM | POA: Diagnosis not present

## 2019-06-11 LAB — LIPID PANEL
Cholesterol: 169 mg/dL (ref 0–200)
HDL: 41.5 mg/dL (ref >39.00)
LDL Cholesterol: 100 mg/dL — ABNORMAL HIGH (ref 0–99)
NonHDL: 127.58
Total CHOL/HDL Ratio: 4
Triglycerides: 137 mg/dL (ref 0.0–149.0)
VLDL: 27.4 mg/dL (ref 0.0–40.0)

## 2019-06-11 LAB — URINALYSIS, ROUTINE W REFLEX MICROSCOPIC
Bilirubin Urine: NEGATIVE
Hgb urine dipstick: NEGATIVE
Ketones, ur: NEGATIVE
Leukocytes,Ua: NEGATIVE
Nitrite: NEGATIVE
Specific Gravity, Urine: 1.025 (ref 1.000–1.030)
Total Protein, Urine: NEGATIVE
Urine Glucose: NEGATIVE
Urobilinogen, UA: 0.2 (ref 0.0–1.0)
pH: 6 (ref 5.0–8.0)

## 2019-06-11 LAB — VITAMIN D 25 HYDROXY (VIT D DEFICIENCY, FRACTURES): VITD: 31.76 ng/mL (ref 30.00–100.00)

## 2019-06-11 LAB — LDL CHOLESTEROL, DIRECT: Direct LDL: 112 mg/dL

## 2019-06-11 LAB — COMPREHENSIVE METABOLIC PANEL
ALT: 41 U/L (ref 0–53)
AST: 23 U/L (ref 0–37)
Albumin: 4.2 g/dL (ref 3.5–5.2)
Alkaline Phosphatase: 76 U/L (ref 39–117)
BUN: 14 mg/dL (ref 6–23)
CO2: 29 mEq/L (ref 19–32)
Calcium: 9.2 mg/dL (ref 8.4–10.5)
Chloride: 101 mEq/L (ref 96–112)
Creatinine, Ser: 0.87 mg/dL (ref 0.40–1.50)
GFR: 97.46 mL/min (ref >60.00)
Glucose, Bld: 153 mg/dL — ABNORMAL HIGH (ref 70–99)
Potassium: 4.5 mEq/L (ref 3.5–5.1)
Sodium: 136 mEq/L (ref 135–145)
Total Bilirubin: 0.6 mg/dL (ref 0.2–1.2)
Total Protein: 7.1 g/dL (ref 6.0–8.3)

## 2019-06-11 LAB — CBC
HCT: 43.9 % (ref 39.0–52.0)
Hemoglobin: 14.8 g/dL (ref 13.0–17.0)
MCHC: 33.7 g/dL (ref 30.0–36.0)
MCV: 87.8 fl (ref 78.0–100.0)
Platelets: 259 10*3/uL (ref 150.0–400.0)
RBC: 5 Mil/uL (ref 4.22–5.81)
RDW: 13.1 % (ref 11.5–15.5)
WBC: 6.5 10*3/uL (ref 4.0–10.5)

## 2019-06-11 LAB — HEMOGLOBIN A1C: Hgb A1c MFr Bld: 7.8 % — ABNORMAL HIGH (ref 4.6–6.5)

## 2019-06-11 LAB — URIC ACID: Uric Acid, Serum: 5.3 mg/dL (ref 4.0–7.8)

## 2019-06-11 NOTE — Progress Notes (Signed)
Established Patient Office Visit  Subjective:  Patient ID: Wayne Castillo, male    DOB: 1980-10-30  Age: 39 y.o. MRN: [id number]  CC:  Chief Complaint  Patient presents with  . Follow-up    follow up on medications, pt would like A1c checked today.    HPI Wayne Castillo presents for follow-up of his diabetes, vitamin D deficiency gout and obesity.  Gastric sleeve surgery is still pending.  He has been compliant with the high-dose vitamin D, allopurinol and Metformin.  He did see the dentist over this past year.  Will be needing a retina check.  Past Medical History:  Diagnosis Date  . Diabetes mellitus without complication (HCC)   . Gout     Past Surgical History:  Procedure Laterality Date  . EAR TUBE REMOVAL    . WISDOM TOOTH EXTRACTION      Family History  Problem Relation Age of Onset  . Amblyopia Neg Hx   . Blindness Neg Hx   . Cataracts Neg Hx   . Glaucoma Neg Hx   . Macular degeneration Neg Hx   . Retinal detachment Neg Hx   . Strabismus Neg Hx   . Retinitis pigmentosa Neg Hx     Social History   Socioeconomic History  . Marital status: Single    Spouse name: Not on file  . Number of children: Not on file  . Years of education: Not on file  . Highest education level: Not on file  Occupational History  . Not on file  Tobacco Use  . Smoking status: Never Smoker  . Smokeless tobacco: Never Used  Substance and Sexual Activity  . Alcohol use: No  . Drug use: No  . Sexual activity: Not on file  Other Topics Concern  . Not on file  Social History Narrative  . Not on file   Social Determinants of Health   Financial Resource Strain:   . Difficulty of Paying Living Expenses: Not on file  Food Insecurity:   . Worried About Programme researcher, broadcasting/film/video in the Last Year: Not on file  . Ran Out of Food in the Last Year: Not on file  Transportation Needs:   . Lack of Transportation (Medical): Not on file  . Lack of Transportation (Non-Medical): Not on file    Physical Activity:   . Days of Exercise per Week: Not on file  . Minutes of Exercise per Session: Not on file  Stress:   . Feeling of Stress : Not on file  Social Connections:   . Frequency of Communication with Friends and Family: Not on file  . Frequency of Social Gatherings with Friends and Family: Not on file  . Attends Religious Services: Not on file  . Active Member of Clubs or Organizations: Not on file  . Attends Banker Meetings: Not on file  . Marital Status: Not on file  Intimate Partner Violence:   . Fear of Current or Ex-Partner: Not on file  . Emotionally Abused: Not on file  . Physically Abused: Not on file  . Sexually Abused: Not on file    Outpatient Medications Prior to Visit  Medication Sig Dispense Refill  . allopurinol (ZYLOPRIM) 300 MG tablet TAKE 1 TABLET(300 MG) BY MOUTH DAILY 90 tablet 1  . metFORMIN (GLUCOPHAGE) 1000 MG tablet TAKE 1 TABLET(1000 MG) BY MOUTH TWICE DAILY WITH A MEAL 180 tablet 0  . olmesartan (BENICAR) 5 MG tablet TAKE 1 TABLET(5 MG) BY MOUTH DAILY  90 tablet 1  . Vitamin D, Ergocalciferol, (DRISDOL) 1.25 MG (50000 UT) CAPS capsule TAKE 1 CAPSULE BY MOUTH EVERY 7 DAYS 5 capsule 2  . promethazine-dextromethorphan (PROMETHAZINE-DM) 6.25-15 MG/5ML syrup Take 5 mLs by mouth 4 (four) times daily as needed for cough. (Patient not taking: Reported on 06/11/2019) 118 mL 0   No facility-administered medications prior to visit.    No Known Allergies  ROS Review of Systems  Constitutional: Negative.   HENT: Negative.   Eyes: Negative for photophobia and visual disturbance.  Respiratory: Negative.   Cardiovascular: Negative.   Gastrointestinal: Negative.   Endocrine: Negative for polyphagia and polyuria.  Genitourinary: Negative.   Musculoskeletal: Negative for arthralgias, gait problem and joint swelling.  Skin: Negative for pallor and rash.  Allergic/Immunologic: Negative for immunocompromised state.  Neurological: Negative for  tremors and speech difficulty.  Hematological: Does not bruise/bleed easily.  Psychiatric/Behavioral: Negative.       Objective:    Physical Exam  Constitutional: He is oriented to person, place, and time. He appears well-developed and well-nourished. No distress.  HENT:  Head: Normocephalic and atraumatic.  Right Ear: External ear normal.  Left Ear: External ear normal.  Eyes: Conjunctivae are normal. Right eye exhibits no discharge. Left eye exhibits no discharge. No scleral icterus.  Neck: No JVD present. No tracheal deviation present. No thyromegaly present.  Cardiovascular: Normal rate, regular rhythm and normal heart sounds.  Pulses:      Dorsalis pedis pulses are 2+ on the right side and 2+ on the left side.       Posterior tibial pulses are 2+ on the right side and 2+ on the left side.  Pulmonary/Chest: Effort normal and breath sounds normal. No stridor.  Musculoskeletal:        General: No edema.     Cervical back: Neck supple.  Lymphadenopathy:    He has no cervical adenopathy.  Neurological: He is alert and oriented to person, place, and time.  Skin: Skin is warm and dry. He is not diaphoretic.  Psychiatric: He has a normal mood and affect. His behavior is normal.   Diabetic Foot Exam - Simple   Simple Foot Form Diabetic Foot exam was performed with the following findings: Yes 06/11/2019 10:26 AM  Visual Inspection See comments: Yes Sensation Testing Intact to touch and monofilament testing bilaterally: Yes Pulse Check Posterior Tibialis and Dorsalis pulse intact bilaterally: Yes Comments Pes planus feet.      BP 122/70   Pulse 75   Temp (!) 97.4 F (36.3 C) (Tympanic)   Ht 5\' 9"  (1.753 m)   Wt (!) 314 lb 3.2 oz (142.5 kg)   SpO2 95%   BMI 46.40 kg/m  Wt Readings from Last 3 Encounters:  06/11/19 (!) 314 lb 3.2 oz (142.5 kg)  06/07/19 (!) 315 lb 4.8 oz (143 kg)  11/25/18 (!) 317 lb 8 oz (144 kg)     Health Maintenance Due  Topic Date Due  .  PNEUMOCOCCAL POLYSACCHARIDE VACCINE AGE 59-64 HIGH RISK  12/24/1981  . INFLUENZA VACCINE  11/07/2018  . OPHTHALMOLOGY EXAM  02/12/2019  . HEMOGLOBIN A1C  03/10/2019    There are no preventive care reminders to display for this patient.  Lab Results  Component Value Date   TSH 0.61 01/13/2018   Lab Results  Component Value Date   WBC 6.3 01/13/2018   HGB 14.2 01/13/2018   HCT 42.4 01/13/2018   MCV 87.4 01/13/2018   PLT 242.0 01/13/2018   Lab Results  Component Value Date   NA 138 09/08/2018   K 3.9 09/08/2018   CO2 30 09/08/2018   GLUCOSE 158 (H) 09/08/2018   BUN 13 09/08/2018   CREATININE 0.84 09/08/2018   BILITOT 0.5 09/08/2018   ALKPHOS 73 09/08/2018   AST 21 09/08/2018   ALT 36 09/08/2018   PROT 6.9 09/08/2018   ALBUMIN 4.0 09/08/2018   CALCIUM 9.0 09/08/2018   GFR 101.88 09/08/2018   Lab Results  Component Value Date   CHOL 149 09/08/2018   Lab Results  Component Value Date   HDL 37.20 (L) 09/08/2018   Lab Results  Component Value Date   LDLCALC 80 09/08/2018   Lab Results  Component Value Date   TRIG 161.0 (H) 09/08/2018   Lab Results  Component Value Date   CHOLHDL 4 09/08/2018   Lab Results  Component Value Date   HGBA1C 8.2 (H) 09/08/2018      Assessment & Plan:   Problem List Items Addressed This Visit      Endocrine   Controlled type 2 diabetes mellitus without complication, without long-term current use of insulin ([place]) - Primary   Relevant Orders   CBC   Comprehensive metabolic panel   LDL cholesterol, direct   Lipid panel   Hemoglobin A1c   Urinalysis, Routine w reflex microscopic   VITAMIN D 25 Hydroxy (Vit-D Deficiency, Fractures)   Ambulatory referral to Ophthalmology     Other   Obesity   Gout   Relevant Orders   Uric acid    Other Visit Diagnoses    Vitamin D deficiency       Relevant Orders   VITAMIN D 25 Hydroxy (Vit-D Deficiency, Fractures)      No orders of the defined types were placed in this  encounter.   Follow-up: Return in about 6 months (around 12/12/2019).   Patient was given information on preventing complications associated with diabetes.  Hopefully he will have had his gastric sleeve surgery before he sees me again in 6 months. Libby Maw, MD

## 2019-06-11 NOTE — Patient Instructions (Signed)

## 2019-06-16 ENCOUNTER — Ambulatory Visit: Payer: Self-pay | Admitting: Surgery

## 2019-06-16 NOTE — H&P (Signed)
Surgical H&P  Chief Complaint: obesity  HPI: 39 year old gentleman presents for follow-up of surgical management of severe obesity.  He was initially evaluated by my partner Dr. Ezzard Standing last year.  He has completed the preoperative pathway for sleep gastrectomy with no barriers identified.  He is ready to proceed with surgery.  He denies any changes in his health in the last year.  Initial visit 07/03/18, Dr. Ezzard Standing: The patient is a 39 year old male who presents with a complaint of weight loss surgery.  The PCP is Dr. Gayla Doss The patient was referred by  Dr. Gayla Doss   He comes by himself. [corona virus days]        He is interested in weight loss surgery.  He went to an information with Dr. Sheliah Hatch around last October (2019).       He has seen nutritionist for his diabetes and worked on weight loss through her.  He has tried to decrease his intake and go low calorie.  He has not tried any name diets.  He has not been on any medication for weight loss.       He is interested in the sleeve gastrectomy.  He has a coworker who has had weight loss surgery, though he is uncertain which operation he had.  I talked about our bariatric support group as an option to me people with surgery.        Per the 1991 NIH Consensus Statement, the patient is a candidate for bariatric surgery.  The patient attended our initial information session and reviewed the types of bariatric surgery.         The patient is interested in the sleeve gastrectomy.  I discussed with the patient the indications and risks of bariatric surgery.  The potential risks of surgery include, but are not limited to, bleeding, infection, leak from the bowel, DVT and PE, open surgery, long term nutrition consequences, and death.  The patient understands the importance of compliance and long term follow-up with our group after surgery.       From here we will obtain lab tests, x-rays, nutrition consult, and psych consult.  PLan:  1)   Update labs and x-rays,  2)  Psych and nutrtion consultation,  3)  EMMI and consent for sleeve  Review of Systems as stated in this history (HPI) or in the review of systems.  Otherwise all other 12 point ROS are negative  Past Medical History: 1.  Morbid obesity      Initial weight - 316, BMI - 48 2.  DM x 2 years      Last HgbA1C - 07/08/2017 - 6.3 3.  Gout  Social History: Unmarried No children      Works for the post [employer] No Known Allergies  Past Medical History:  Diagnosis Date   Diabetes mellitus without complication (HCC)    Gout     Past Surgical History:  Procedure Laterality Date   EAR TUBE REMOVAL     WISDOM TOOTH EXTRACTION      Family History  Problem Relation Age of Onset   Amblyopia Neg Hx    Blindness Neg Hx    Cataracts Neg Hx    Glaucoma Neg Hx    Macular degeneration Neg Hx    Retinal detachment Neg Hx    Strabismus Neg Hx    Retinitis pigmentosa Neg Hx     Social History   Socioeconomic History   Marital status: Single  Spouse name: Not on file   Number of children: Not on file   Years of education: Not on file   Highest education level: Not on file  Occupational History   Not on file  Tobacco Use   Smoking status: Never Smoker   Smokeless tobacco: Never Used  Substance and Sexual Activity   Alcohol use: No   Drug use: No   Sexual activity: Not on file  Other Topics Concern   Not on file  Social History Narrative   Not on file   Social Determinants of Health   Financial Resource Strain:    Difficulty of Paying Living Expenses: Not on file  Food Insecurity:    Worried About Running Out of Food in the Last Year: Not on file   Ran Out of Food in the Last Year: Not on file  Transportation Needs:    Lack of Transportation (Medical): Not on file   Lack of Transportation (Non-Medical): Not on file  Physical Activity:    Days of Exercise per Week: Not on file   Minutes of Exercise per Session: Not on file  Stress:    Feeling  of Stress : Not on file  Social Connections:    Frequency of Communication with Friends and Family: Not on file   Frequency of Social Gatherings with Friends and Family: Not on file   Attends Religious Services: Not on file   Active Member of Clubs or Organizations: Not on file   Attends Banker Meetings: Not on file   Marital Status: Not on file    Current Outpatient Medications on File Prior to Visit  Medication Sig Dispense Refill   allopurinol (ZYLOPRIM) 300 MG tablet TAKE 1 TABLET(300 MG) BY MOUTH DAILY 90 tablet 1   metFORMIN (GLUCOPHAGE) 1000 MG tablet TAKE 1 TABLET(1000 MG) BY MOUTH TWICE DAILY WITH A MEAL 180 tablet 0   olmesartan (BENICAR) 5 MG tablet TAKE 1 TABLET(5 MG) BY MOUTH DAILY 90 tablet 1   Vitamin D, Ergocalciferol, (DRISDOL) 1.25 MG (50000 UT) CAPS capsule TAKE 1 CAPSULE BY MOUTH EVERY 7 DAYS 5 capsule 2   No current facility-administered medications on file prior to visit.    Review of Systems: a complete, 10pt review of systems was completed with pertinent positives and negatives as documented in the HPI  Physical Exam: Vitals  Weight: 315.8 lb   Height: 68 in  Body Surface Area: 2.48 m   Body Mass Index: 48.02 kg/m   Temp.: 97 F    Pulse: 101 (Regular)    BP: 128/86 (Sitting, Left Arm, Standard)  Alert and well-appearing. Unlabored respirations   CBC Latest Ref Rng & Units 06/11/2019 01/13/2018 07/08/2017  WBC 4.0 - 10.5 K/uL 6.5 6.3 6.9  Hemoglobin 13.0 - 17.0 g/dL 60.7 37.1 06.2  Hematocrit 39.0 - 52.0 % 43.9 42.4 43.8  Platelets 150.0 - 400.0 K/uL 259.0 242.0 244.0    CMP Latest Ref Rng & Units 06/11/2019 09/08/2018 01/13/2018  Glucose 70 - 99 mg/dL 694(W) 546(E) 703(J)  BUN 6 - 23 mg/dL 14 13 14   Creatinine 0.40 - 1.50 mg/dL 0.09 3.81  Sodium 135 - 145 mEq/L 136 138 138  Potassium 3.5 - 5.1 mEq/L 4.5 3.9 4.3  Chloride 96 - 112 mEq/L 101 101 103  CO2 19 - 32 mEq/L 29 30 29   Calcium 8.4 - 10.5 mg/dL 9.2 9.0 9.3  Total Protein 6.0 -  8.3 g/dL 7.1 6.9 7.1  Total Bilirubin 0.2 - 1.2 mg/dL  0.6 0.5 0.4  Alkaline Phos 39 - 117 U/L 76 73 75  AST 0 - 37 U/L 23 21 26   ALT 0 - 53 U/L 41 36 34    No results found for: INR, PROTIME  Imaging: No results found.   A/P: OBESITY, MORBID, BMI 40.0-49.9 (E66.01) Story: He has completed the pathway with no barriers identified. We discussed the surgery including technical aspects, the risks of bleeding, infection, pain, scarring, injury to intra-abdominal structures, staple line leak or abscess, chronic abdominal pain or nausea, new onset or worsened GERD, DVT/PE, pneumonia, heart attack, stroke, death, failure to reach weight loss goals and weight regain, hernia. Discussed the typical peri-, and postoperative course. Discussed the importance of lifelong behavioral changes to combat the chronic and relapsing disease which is obesity. Questions local and answered. We will plan to proceed with surgery  Patient Active Problem List   Diagnosis Date Noted   Acute viral syndrome 08/27/2018   Encounter for health maintenance examination with abnormal findings 01/13/2018   Need for influenza vaccination 01/13/2018   Gout 01/13/2018   Controlled type 2 diabetes mellitus without complication, without long-term current use of insulin ([place]) 07/08/2017   Obesity 06/17/2017       Romana Juniper, MD [facility]  See AMION to contact appropriate on-call provider

## 2019-07-13 ENCOUNTER — Other Ambulatory Visit: Payer: Self-pay | Admitting: Family Medicine

## 2019-07-13 DIAGNOSIS — E559 Vitamin D deficiency, unspecified: Secondary | ICD-10-CM

## 2019-08-20 ENCOUNTER — Other Ambulatory Visit: Payer: Self-pay | Admitting: Family Medicine

## 2019-08-20 DIAGNOSIS — E119 Type 2 diabetes mellitus without complications: Secondary | ICD-10-CM

## 2019-08-23 NOTE — Patient Instructions (Signed)
DUE TO COVID-19 ONLY ONE VISITOR IS ALLOWED TO COME WITH YOU AND STAY IN THE WAITING ROOM ONLY DURING PRE OP AND PROCEDURE DAY OF SURGERY. THE 2 VISITORS MAY VISIT WITH YOU AFTER SURGERY IN YOUR PRIVATE ROOM DURING VISITING HOURS ONLY!  YOU NEED TO HAVE A COVID 19 TEST ON__5/21_____ @__2 :35_____, THIS TEST MUST BE DONE BEFORE SURGERY, COME  [address], [place] , .  [facility]) ONCE YOUR COVID TEST IS COMPLETED, PLEASE BEGIN THE QUARANTINE INSTRUCTIONS AS OUTLINED IN YOUR HANDOUT.                DANTON PALMATEER    Your procedure is scheduled on: 08/31/19   Report to [facility] Main  Entrance   Report to admitting at 12:20 PM     Call this number if you have problems the morning of surgery [phone]    BRUSH YOUR TEETH MORNING OF SURGERY AND RINSE YOUR MOUTH OUT, NO CHEWING GUM CANDY OR MINTS.   MORNING OF SURGERY DRINK:   DRINK 1 G2 drink BEFORE YOU LEAVE HOME, DRINK ALL OF THE  G2 DRINK AT ONE TIME.     NO SOLID FOOD AFTER 6:00 PM THE NIGHT BEFORE YOUR SURGERY.   YOU MAY DRINK CLEAR FLUIDS FROM 6:00;PM UNTIL 11:15 AM    CLEAR LIQUID DIET   Foods Allowed                                                                     Foods Excluded  Coffee and tea, regular and decaf                             liquids that you cannot  Plain Jell-O any favor except red or purple                                           see through such as: Fruit ices (not with fruit pulp)                                     milk, soups, orange juice  Iced Popsicles                                    All solid food Carbonated beverages, regular and diet                                    Cranberry, grape and apple juices Sports drinks like Gatorade Lightly seasoned clear broth or consume(fat free) Sugar, honey syrup      THE G2 DRINK YOU DRINK BEFORE YOU LEAVE HOME WILL BE THE LAST FLUIDS YOU DRINK BEFORE SURGERY.  PAIN IS EXPECTED AFTER SURGERY AND WILL  NOT BE COMPLETELY ELIMINATED.   AMBULATION AND TYLENOL WILL HELP REDUCE INCISIONAL AND GAS PAIN. MOVEMENT IS KEY!  YOU ARE  EXPECTED TO BE OUT OF BED WITHIN 4 HOURS OF ADMISSION TO YOUR PATIENT ROOM.  SITTING IN THE RECLINER THROUGHOUT THE DAY IS IMPORTANT FOR DRINKING FLUIDS AND MOVING GAS THROUGHOUT THE GI TRACT.  COMPRESSION STOCKINGS SHOULD BE WORN [place] UNLESS YOU ARE WALKING.   INCENTIVE SPIROMETER SHOULD BE USED EVERY HOUR WHILE AWAKE TO DECREASE POST-OPERATIVE COMPLICATIONS SUCH AS PNEUMONIA.  WHEN DISCHARGED HOME, IT IS IMPORTANT TO CONTINUE TO WALK EVERY HOUR AND USE THE INCENTIVE SPIROMETER EVERY HOUR.     Take these medicines the morning of surgery with A SIP OF WATER: Allopurinol  DO NOT TAKE ANY DIABETIC MEDICATIONS DAY OF YOUR SURGERY                        How to Manage Your Diabetes Before and After Surgery  Why is it important to control my blood sugar before and after surgery? . Improving blood sugar levels before and after surgery helps healing and can limit problems. . A way of improving blood sugar control is eating a healthy diet by: o  Eating less sugar and carbohydrates o  Increasing activity/exercise o  Talking with your doctor about reaching your blood sugar goals . High blood sugars (greater than 180 mg/dL) can raise your risk of infections and slow your recovery, so you will need to focus on controlling your diabetes during the weeks before surgery. . Make sure that the doctor who takes care of your diabetes knows about your planned surgery including the date and location.  How do I manage my blood sugar before surgery? . Check your blood sugar at least 4 times a day, starting 2 days before surgery, to make sure that the level is not too high or low. o Check your blood sugar the morning of your surgery when you wake up and every 2 hours until you get to the Short Stay unit. . If your blood sugar is less than 70 mg/dL, you will  need to treat for low blood sugar: o Do not take insulin. o Treat a low blood sugar (less than 70 mg/dL) with  cup of clear juice (cranberry or apple), 4 glucose tablets, OR glucose gel. o Recheck blood sugar in 15 minutes after treatment (to make sure it is greater than 70 mg/dL). If your blood sugar is not greater than 70 mg/dL on recheck, call [phone] for further instructions. . Report your blood sugar to the short stay nurse when you get to Short Stay.  . If you are admitted to the hospital after surgery: o Your blood sugar will be checked by the staff and you will probably be given insulin after surgery (instead of oral diabetes medicines) to make sure you have good blood sugar levels. o The goal for blood sugar control after surgery is 80-180 mg/dL.   WHAT DO I DO ABOUT MY DIABETES MEDICATION?  Marland Kitchen Do not take oral diabetes medicines (pills) the morning of surgery.    You may not have any metal on your body including              piercings  Do not wear jewelry, lotions, powders or deodorant                       Men may shave face and neck.   Do not bring valuables to the hospital. [facility] IS NOT  RESPONSIBLE   FOR VALUABLES.  Contacts, dentures or bridgework may not be worn into surgery.       Special Instructions: N/A              Please read over the following fact sheets you were given: _____________________________________________________________________             [facility] - Preparing for Surgery Before surgery, you can play an important role.   Because skin is not sterile, your skin needs to be as free of germs as possible.   You can reduce the number of germs on your skin by washing with CHG (chlorahexidine gluconate) soap before surgery.   CHG is an antiseptic cleaner which kills germs and bonds with the skin to continue killing germs even after washing. Please DO NOT use if you have an allergy to CHG or antibacterial soaps.   If your  skin becomes reddened/irritated stop using the CHG and inform your nurse when you arrive at Short Stay. r.  You may shave your face/neck.  Please follow these instructions carefully:  1.  Shower with CHG Soap the night before surgery and the  morning of Surgery.  2.  If you choose to wash your hair, wash your hair first as usual with your  normal  shampoo.  3.  After you shampoo, rinse your hair and body thoroughly to remove the  shampoo.                                        4.  Use CHG as you would any other liquid soap.  You can apply chg directly  to the skin and wash                       Gently with a scrungie or clean washcloth.  5.  Apply the CHG Soap to your body ONLY FROM THE NECK DOWN.   Do not use on face/ open                           Wound or open sores. Avoid contact with eyes, ears mouth and genitals (private parts).                       Wash face,  Genitals (private parts) with your normal soap.             6.  Wash thoroughly, paying special attention to the area where your surgery  will be performed.  7.  Thoroughly rinse your body with warm water from the neck down.  8.  DO NOT shower/wash with your normal soap after using and rinsing off  the CHG Soap.             9.  Pat yourself dry with a clean towel.            10.  Wear clean pajamas.            11.  Place clean sheets on your bed the night of your first shower and do not  sleep with pets. Day of Surgery : Do not apply any lotions/deodorants the morning of surgery.  Please wear clean clothes to the hospital/surgery center.  FAILURE TO FOLLOW THESE INSTRUCTIONS MAY RESULT IN THE CANCELLATION OF YOUR SURGERY PATIENT SIGNATURE_________________________________  NURSE SIGNATURE__________________________________  ________________________________________________________________________  

## 2019-08-24 ENCOUNTER — Other Ambulatory Visit: Payer: Self-pay

## 2019-08-24 ENCOUNTER — Encounter (HOSPITAL_COMMUNITY)
Admission: RE | Admit: 2019-08-24 | Discharge: 2019-08-24 | Disposition: A | Discharge status: Home / Self Care | Payer: Federal, State, Local not specified - PPO | Source: Ambulatory Visit | Attending: Surgery | Admitting: Surgery

## 2019-08-24 ENCOUNTER — Encounter (HOSPITAL_COMMUNITY): Payer: Self-pay

## 2019-08-24 DIAGNOSIS — Z01818 Encounter for other preprocedural examination: Secondary | ICD-10-CM | POA: Insufficient documentation

## 2019-08-24 DIAGNOSIS — E118 Type 2 diabetes mellitus with unspecified complications: Secondary | ICD-10-CM | POA: Diagnosis not present

## 2019-08-24 HISTORY — DX: Unspecified osteoarthritis, unspecified site: M19.90

## 2019-08-24 HISTORY — DX: Essential (primary) hypertension: I10

## 2019-08-24 NOTE — Progress Notes (Signed)
PCP - Dr. Joylene Igo Cardiologist - no  Chest x-ray - no EKG - 08/25/19 Stress Test - no ECHO - no Cardiac Cath - no  Sleep Study - no CPAP -   Fasting Blood Sugar - 110-120 Checks Blood Sugar _____ times a day not for a year  Blood Thinner Instructions:NA Aspirin Instructions: Last Dose:  Anesthesia review:   Patient denies shortness of breath, fever, cough and chest pain at PAT appointment yes  Patient verbalized understanding of instructions that were given to them at the PAT appointment. Patient was also instructed that they will need to review over the PAT instructions again at home before surgery. yes

## 2019-08-25 ENCOUNTER — Encounter (HOSPITAL_COMMUNITY)
Admission: RE | Admit: 2019-08-25 | Discharge: 2019-08-25 | Disposition: A | Discharge status: Home / Self Care | Payer: Federal, State, Local not specified - PPO | Source: Ambulatory Visit | Attending: Surgery | Admitting: Surgery

## 2019-08-25 DIAGNOSIS — Z01818 Encounter for other preprocedural examination: Secondary | ICD-10-CM | POA: Diagnosis not present

## 2019-08-25 DIAGNOSIS — E118 Type 2 diabetes mellitus with unspecified complications: Secondary | ICD-10-CM | POA: Diagnosis not present

## 2019-08-25 LAB — CBC WITH DIFFERENTIAL/PLATELET
Abs Immature Granulocytes: 0.01 10*3/uL (ref 0.00–0.07)
Basophils Absolute: 0 10*3/uL (ref 0.0–0.1)
Basophils Relative: 0 %
Eosinophils Absolute: 0.1 10*3/uL (ref 0.0–0.5)
Eosinophils Relative: 2 %
HCT: 43.8 % (ref 39.0–52.0)
Hemoglobin: 14.3 g/dL (ref 13.0–17.0)
Immature Granulocytes: 0 %
Lymphocytes Relative: 25 %
Lymphs Abs: 1.8 10*3/uL (ref 0.7–4.0)
MCH: 28.7 pg (ref 26.0–34.0)
MCHC: 32.6 g/dL (ref 30.0–36.0)
MCV: 88 fL (ref 80.0–100.0)
Monocytes Absolute: 0.6 10*3/uL (ref 0.1–1.0)
Monocytes Relative: 9 %
Neutro Abs: 4.7 10*3/uL (ref 1.7–7.7)
Neutrophils Relative %: 64 %
Platelets: 275 10*3/uL (ref 150–400)
RBC: 4.98 MIL/uL (ref 4.22–5.81)
RDW: 12.4 % (ref 11.5–15.5)
WBC: 7.3 10*3/uL (ref 4.0–10.5)
nRBC: 0 % (ref 0.0–0.2)

## 2019-08-25 LAB — COMPREHENSIVE METABOLIC PANEL
ALT: 42 U/L (ref 0–44)
AST: 27 U/L (ref 15–41)
Albumin: 4.6 g/dL (ref 3.5–5.0)
Alkaline Phosphatase: 67 U/L (ref 38–126)
Anion gap: 11 (ref 5–15)
BUN: 24 mg/dL — ABNORMAL HIGH (ref 6–20)
CO2: 23 mmol/L (ref 22–32)
Calcium: 9.3 mg/dL (ref 8.9–10.3)
Chloride: 102 mmol/L (ref 98–111)
Creatinine, Ser: 0.79 mg/dL (ref 0.61–1.24)
GFR calc Af Amer: >60 mL/min (ref >60)
GFR calc non Af Amer: >60 mL/min (ref >60)
Glucose, Bld: 99 mg/dL (ref 70–99)
Potassium: 4.2 mmol/L (ref 3.5–5.1)
Sodium: 136 mmol/L (ref 135–145)
Total Bilirubin: 0.7 mg/dL (ref 0.3–1.2)
Total Protein: 7.7 g/dL (ref 6.5–8.1)

## 2019-08-25 LAB — GLUCOSE, CAPILLARY: Glucose-Capillary: 104 mg/dL — ABNORMAL HIGH (ref 70–99)

## 2019-08-27 ENCOUNTER — Other Ambulatory Visit (HOSPITAL_COMMUNITY)
Admission: RE | Admit: 2019-08-27 | Discharge: 2019-08-27 | Disposition: A | Discharge status: Home / Self Care | Payer: Federal, State, Local not specified - PPO | Source: Ambulatory Visit | Attending: Surgery | Admitting: Surgery

## 2019-08-27 DIAGNOSIS — Z01812 Encounter for preprocedural laboratory examination: Secondary | ICD-10-CM | POA: Diagnosis not present

## 2019-08-27 DIAGNOSIS — Z20822 Contact with and (suspected) exposure to covid-19: Secondary | ICD-10-CM | POA: Insufficient documentation

## 2019-08-27 LAB — SARS CORONAVIRUS 2 (TAT 6-24 HRS): SARS Coronavirus 2: NEGATIVE

## 2019-08-30 MED ORDER — BUPIVACAINE LIPOSOME 1.3 % IJ SUSP
20.0000 mL | INTRAMUSCULAR | Status: DC
  Filled 2019-08-30: qty 20

## 2019-08-31 ENCOUNTER — Inpatient Hospital Stay (HOSPITAL_COMMUNITY): Payer: Federal, State, Local not specified - PPO | Admitting: Certified Registered Nurse Anesthetist

## 2019-08-31 ENCOUNTER — Inpatient Hospital Stay (HOSPITAL_COMMUNITY)
Admission: RE | Admit: 2019-08-31 | Discharge: 2019-09-01 | DRG: 621 | Disposition: A | Discharge status: Home / Self Care | Payer: Federal, State, Local not specified - PPO | Attending: Surgery | Admitting: Surgery

## 2019-08-31 ENCOUNTER — Encounter (HOSPITAL_COMMUNITY): Payer: Self-pay | Admitting: Surgery

## 2019-08-31 ENCOUNTER — Other Ambulatory Visit: Payer: Self-pay

## 2019-08-31 ENCOUNTER — Encounter (HOSPITAL_COMMUNITY)
Admission: RE | Disposition: A | Discharge status: Home / Self Care | Payer: Self-pay | Source: Home / Self Care | Attending: Surgery

## 2019-08-31 DIAGNOSIS — M109 Gout, unspecified: Secondary | ICD-10-CM | POA: Diagnosis not present

## 2019-08-31 DIAGNOSIS — M1990 Unspecified osteoarthritis, unspecified site: Secondary | ICD-10-CM | POA: Diagnosis not present

## 2019-08-31 DIAGNOSIS — I10 Essential (primary) hypertension: Secondary | ICD-10-CM | POA: Diagnosis present

## 2019-08-31 DIAGNOSIS — Z7984 Long term (current) use of oral hypoglycemic drugs: Secondary | ICD-10-CM

## 2019-08-31 DIAGNOSIS — Z6841 Body Mass Index (BMI) 40.0 and over, adult: Secondary | ICD-10-CM

## 2019-08-31 DIAGNOSIS — Z20822 Contact with and (suspected) exposure to covid-19: Secondary | ICD-10-CM | POA: Diagnosis present

## 2019-08-31 DIAGNOSIS — Z79899 Other long term (current) drug therapy: Secondary | ICD-10-CM

## 2019-08-31 DIAGNOSIS — E119 Type 2 diabetes mellitus without complications: Secondary | ICD-10-CM | POA: Diagnosis present

## 2019-08-31 HISTORY — PX: LAPAROSCOPIC GASTRIC SLEEVE RESECTION: SHX5895

## 2019-08-31 LAB — GLUCOSE, CAPILLARY
Glucose-Capillary: 141 mg/dL — ABNORMAL HIGH (ref 70–99)
Glucose-Capillary: 144 mg/dL — ABNORMAL HIGH (ref 70–99)
Glucose-Capillary: 148 mg/dL — ABNORMAL HIGH (ref 70–99)
Glucose-Capillary: 154 mg/dL — ABNORMAL HIGH (ref 70–99)
Glucose-Capillary: 95 mg/dL (ref 70–99)

## 2019-08-31 LAB — TYPE AND SCREEN
ABO/RH(D): A POS
Antibody Screen: NEGATIVE

## 2019-08-31 LAB — ABO/RH: ABO/RH(D): A POS

## 2019-08-31 SURGERY — GASTRECTOMY, SLEEVE, LAPAROSCOPIC
Anesthesia: General

## 2019-08-31 MED ORDER — OXYCODONE HCL 5 MG/5ML PO SOLN
5.0000 mg | Freq: Four times a day (QID) | ORAL | Status: DC | PRN
  Administered 2019-08-31: 5 mg via ORAL
  Filled 2019-08-31: qty 5

## 2019-08-31 MED ORDER — FENTANYL CITRATE (PF) 100 MCG/2ML IJ SOLN: 25.0000 ug | INTRAMUSCULAR | Status: DC | PRN

## 2019-08-31 MED ORDER — PROPOFOL 10 MG/ML IV BOLUS
INTRAVENOUS | Status: DC | PRN
  Administered 2019-08-31: 200 mg via INTRAVENOUS

## 2019-08-31 MED ORDER — ROCURONIUM BROMIDE 10 MG/ML (PF) SYRINGE
PREFILLED_SYRINGE | INTRAVENOUS | Status: DC | PRN
  Administered 2019-08-31: 70 mg via INTRAVENOUS
  Administered 2019-08-31: 10 mg via INTRAVENOUS

## 2019-08-31 MED ORDER — CHLORHEXIDINE GLUCONATE 4 % EX LIQD: 60.0000 mL | Freq: Once | CUTANEOUS | Status: DC

## 2019-08-31 MED ORDER — LACTATED RINGERS IV SOLN: INTRAVENOUS | Status: DC

## 2019-08-31 MED ORDER — PANTOPRAZOLE SODIUM 40 MG IV SOLR
40.0000 mg | Freq: Every day | INTRAVENOUS | Status: DC
  Administered 2019-08-31: 40 mg via INTRAVENOUS
  Filled 2019-08-31: qty 40

## 2019-08-31 MED ORDER — GABAPENTIN 300 MG PO CAPS
300.0000 mg | ORAL_CAPSULE | ORAL | Status: AC
  Administered 2019-08-31: 300 mg via ORAL
  Filled 2019-08-31: qty 1

## 2019-08-31 MED ORDER — MIDAZOLAM HCL 2 MG/2ML IJ SOLN
INTRAMUSCULAR | Status: AC
  Filled 2019-08-31: qty 2

## 2019-08-31 MED ORDER — ACETAMINOPHEN 160 MG/5ML PO SOLN: 1000.0000 mg | Freq: Three times a day (TID) | ORAL | Status: DC

## 2019-08-31 MED ORDER — KETAMINE HCL 10 MG/ML IJ SOLN
INTRAMUSCULAR | Status: DC | PRN
  Administered 2019-08-31: 40 mg via INTRAVENOUS

## 2019-08-31 MED ORDER — ONDANSETRON HCL 4 MG/2ML IJ SOLN: 4.0000 mg | INTRAMUSCULAR | Status: DC | PRN

## 2019-08-31 MED ORDER — ROCURONIUM BROMIDE 10 MG/ML (PF) SYRINGE
PREFILLED_SYRINGE | INTRAVENOUS | Status: AC
  Filled 2019-08-31: qty 10

## 2019-08-31 MED ORDER — ENOXAPARIN SODIUM 30 MG/0.3ML ~~LOC~~ SOLN
30.0000 mg | Freq: Two times a day (BID) | SUBCUTANEOUS | Status: DC
  Administered 2019-09-01: 30 mg via SUBCUTANEOUS
  Filled 2019-08-31: qty 0.3

## 2019-08-31 MED ORDER — PROMETHAZINE HCL 25 MG/ML IJ SOLN: 6.2500 mg | INTRAMUSCULAR | Status: DC | PRN

## 2019-08-31 MED ORDER — BUPIVACAINE-EPINEPHRINE 0.25% -1:200000 IJ SOLN
INTRAMUSCULAR | Status: DC | PRN
  Administered 2019-08-31: 30 mL

## 2019-08-31 MED ORDER — INSULIN ASPART 100 UNIT/ML ~~LOC~~ SOLN
0.0000 [IU] | SUBCUTANEOUS | Status: DC
  Administered 2019-08-31: 4 [IU] via SUBCUTANEOUS
  Administered 2019-08-31 – 2019-09-01 (×2): 3 [IU] via SUBCUTANEOUS

## 2019-08-31 MED ORDER — SIMETHICONE 80 MG PO CHEW: 80.0000 mg | CHEWABLE_TABLET | Freq: Four times a day (QID) | ORAL | Status: DC | PRN

## 2019-08-31 MED ORDER — CELECOXIB 200 MG PO CAPS
200.0000 mg | ORAL_CAPSULE | Freq: Once | ORAL | Status: AC
  Administered 2019-08-31: 200 mg via ORAL
  Filled 2019-08-31: qty 1

## 2019-08-31 MED ORDER — FENTANYL CITRATE (PF) 250 MCG/5ML IJ SOLN
INTRAMUSCULAR | Status: AC
  Filled 2019-08-31: qty 5

## 2019-08-31 MED ORDER — SCOPOLAMINE 1 MG/3DAYS TD PT72
1.0000 | MEDICATED_PATCH | TRANSDERMAL | Status: DC
  Administered 2019-08-31: 1.5 mg via TRANSDERMAL
  Filled 2019-08-31: qty 1

## 2019-08-31 MED ORDER — CHLORHEXIDINE GLUCONATE 4 % EX LIQD
60.0000 mL | Freq: Once | CUTANEOUS | Status: AC
  Administered 2019-08-31: 4 via TOPICAL

## 2019-08-31 MED ORDER — HYDROMORPHONE HCL 1 MG/ML IJ SOLN: 0.5000 mg | INTRAMUSCULAR | Status: DC | PRN

## 2019-08-31 MED ORDER — GLYCOPYRROLATE PF 0.2 MG/ML IJ SOSY
PREFILLED_SYRINGE | INTRAMUSCULAR | Status: DC | PRN
  Administered 2019-08-31: .2 mg via INTRAVENOUS

## 2019-08-31 MED ORDER — SODIUM CHLORIDE 0.9 % IV SOLN
2.0000 g | INTRAVENOUS | Status: AC
  Administered 2019-08-31: 2 g via INTRAVENOUS
  Filled 2019-08-31: qty 2

## 2019-08-31 MED ORDER — ENSURE MAX PROTEIN PO LIQD
2.0000 [oz_av] | ORAL | Status: DC
  Administered 2019-09-01 (×3): 2 [oz_av] via ORAL

## 2019-08-31 MED ORDER — ONDANSETRON HCL 4 MG/2ML IJ SOLN
INTRAMUSCULAR | Status: AC
  Filled 2019-08-31: qty 2

## 2019-08-31 MED ORDER — LIDOCAINE 2% (20 MG/ML) 5 ML SYRINGE
INTRAMUSCULAR | Status: DC | PRN
  Administered 2019-08-31: 80 mg via INTRAVENOUS

## 2019-08-31 MED ORDER — SODIUM CHLORIDE 0.9 % IV SOLN: INTRAVENOUS | Status: DC

## 2019-08-31 MED ORDER — ACETAMINOPHEN 500 MG PO TABS
1000.0000 mg | ORAL_TABLET | ORAL | Status: AC
  Administered 2019-08-31: 1000 mg via ORAL
  Filled 2019-08-31: qty 2

## 2019-08-31 MED ORDER — DEXAMETHASONE SODIUM PHOSPHATE 10 MG/ML IJ SOLN
INTRAMUSCULAR | Status: DC | PRN
  Administered 2019-08-31: 10 mg via INTRAVENOUS

## 2019-08-31 MED ORDER — EPHEDRINE SULFATE-NACL 50-0.9 MG/10ML-% IV SOSY
PREFILLED_SYRINGE | INTRAVENOUS | Status: DC | PRN
  Administered 2019-08-31: 10 mg via INTRAVENOUS

## 2019-08-31 MED ORDER — GLYCOPYRROLATE PF 0.2 MG/ML IJ SOSY
PREFILLED_SYRINGE | INTRAMUSCULAR | Status: AC
  Filled 2019-08-31: qty 1

## 2019-08-31 MED ORDER — ONDANSETRON HCL 4 MG/2ML IJ SOLN
INTRAMUSCULAR | Status: DC | PRN
  Administered 2019-08-31: 4 mg via INTRAVENOUS

## 2019-08-31 MED ORDER — FENTANYL CITRATE (PF) 100 MCG/2ML IJ SOLN
INTRAMUSCULAR | Status: DC | PRN
  Administered 2019-08-31: 100 ug via INTRAVENOUS

## 2019-08-31 MED ORDER — PROPOFOL 10 MG/ML IV BOLUS
INTRAVENOUS | Status: AC
  Filled 2019-08-31: qty 20

## 2019-08-31 MED ORDER — DOCUSATE SODIUM 100 MG PO CAPS
100.0000 mg | ORAL_CAPSULE | Freq: Two times a day (BID) | ORAL | Status: DC
  Administered 2019-08-31 – 2019-09-01 (×2): 100 mg via ORAL
  Filled 2019-08-31 (×2): qty 1

## 2019-08-31 MED ORDER — METOCLOPRAMIDE HCL 5 MG/ML IJ SOLN
10.0000 mg | Freq: Four times a day (QID) | INTRAMUSCULAR | Status: DC
  Administered 2019-08-31 – 2019-09-01 (×4): 10 mg via INTRAVENOUS
  Filled 2019-08-31 (×4): qty 2

## 2019-08-31 MED ORDER — DEXAMETHASONE SODIUM PHOSPHATE 10 MG/ML IJ SOLN
INTRAMUSCULAR | Status: AC
  Filled 2019-08-31: qty 1

## 2019-08-31 MED ORDER — APREPITANT 40 MG PO CAPS
40.0000 mg | ORAL_CAPSULE | ORAL | Status: AC
  Administered 2019-08-31: 40 mg via ORAL
  Filled 2019-08-31: qty 1

## 2019-08-31 MED ORDER — ACETAMINOPHEN 500 MG PO TABS
1000.0000 mg | ORAL_TABLET | Freq: Three times a day (TID) | ORAL | Status: DC
  Administered 2019-08-31 – 2019-09-01 (×2): 1000 mg via ORAL
  Filled 2019-08-31 (×2): qty 2

## 2019-08-31 MED ORDER — TRAMADOL HCL 50 MG PO TABS: 50.0000 mg | ORAL_TABLET | Freq: Four times a day (QID) | ORAL | Status: DC | PRN

## 2019-08-31 MED ORDER — CHLORHEXIDINE GLUCONATE 0.12 % MT SOLN
15.0000 mL | Freq: Once | OROMUCOSAL | Status: AC
  Administered 2019-08-31: 15 mL via OROMUCOSAL

## 2019-08-31 MED ORDER — EPHEDRINE 5 MG/ML INJ
INTRAVENOUS | Status: AC
  Filled 2019-08-31: qty 10

## 2019-08-31 MED ORDER — HEPARIN SODIUM (PORCINE) 5000 UNIT/ML IJ SOLN
5000.0000 [IU] | INTRAMUSCULAR | Status: AC
  Administered 2019-08-31: 5000 [IU] via SUBCUTANEOUS
  Filled 2019-08-31: qty 1

## 2019-08-31 MED ORDER — METHOCARBAMOL 1000 MG/10ML IJ SOLN
500.0000 mg | Freq: Four times a day (QID) | INTRAVENOUS | Status: DC | PRN
  Filled 2019-08-31: qty 5

## 2019-08-31 MED ORDER — HYDRALAZINE HCL 20 MG/ML IJ SOLN: 10.0000 mg | INTRAMUSCULAR | Status: DC | PRN

## 2019-08-31 MED ORDER — METOPROLOL TARTRATE 5 MG/5ML IV SOLN: 5.0000 mg | Freq: Four times a day (QID) | INTRAVENOUS | Status: DC | PRN

## 2019-08-31 MED ORDER — LACTATED RINGERS IR SOLN
Status: DC | PRN
  Administered 2019-08-31: 1000 mL

## 2019-08-31 MED ORDER — ALLOPURINOL 300 MG PO TABS
300.0000 mg | ORAL_TABLET | Freq: Every day | ORAL | Status: DC
  Administered 2019-09-01: 300 mg via ORAL
  Filled 2019-08-31: qty 1

## 2019-08-31 MED ORDER — BUPIVACAINE LIPOSOME 1.3 % IJ SUSP
INTRAMUSCULAR | Status: DC | PRN
  Administered 2019-08-31: 20 mL

## 2019-08-31 MED ORDER — MIDAZOLAM HCL 5 MG/5ML IJ SOLN
INTRAMUSCULAR | Status: DC | PRN
  Administered 2019-08-31: 2 mg via INTRAVENOUS

## 2019-08-31 MED ORDER — SUGAMMADEX SODIUM 200 MG/2ML IV SOLN
INTRAVENOUS | Status: DC | PRN
  Administered 2019-08-31: 400 mg via INTRAVENOUS

## 2019-08-31 MED ORDER — GABAPENTIN 100 MG PO CAPS
200.0000 mg | ORAL_CAPSULE | Freq: Two times a day (BID) | ORAL | Status: DC
  Administered 2019-08-31 – 2019-09-01 (×2): 200 mg via ORAL
  Filled 2019-08-31 (×2): qty 2

## 2019-08-31 MED ORDER — BUPIVACAINE HCL 0.25 % IJ SOLN
INTRAMUSCULAR | Status: AC
  Filled 2019-08-31: qty 1

## 2019-08-31 SURGICAL SUPPLY — 71 items
APPLICATOR COTTON TIP 6 STRL (MISCELLANEOUS) IMPLANT
APPLICATOR COTTON TIP 6IN STRL (MISCELLANEOUS)
APPLIER CLIP ROT 10 11.4 M/L (STAPLE)
APPLIER CLIP ROT 13.4 12 LRG (CLIP)
BAG LAPAROSCOPIC 12 15 PORT 16 (BASKET) IMPLANT
BAG RETRIEVAL 12/15 (BASKET)
BENZOIN TINCTURE PRP APPL 2/3 (GAUZE/BANDAGES/DRESSINGS) ×2 IMPLANT
BLADE SURG SZ11 CARB STEEL (BLADE) ×2 IMPLANT
BNDG ADH 1X3 SHEER STRL LF (GAUZE/BANDAGES/DRESSINGS) ×12 IMPLANT
CABLE HIGH FREQUENCY MONO STRZ (ELECTRODE) ×2 IMPLANT
CHLORAPREP W/TINT 26 (MISCELLANEOUS) ×4 IMPLANT
CLIP APPLIE ROT 10 11.4 M/L (STAPLE) IMPLANT
CLIP APPLIE ROT 13.4 12 LRG (CLIP) IMPLANT
CLSR STERI-STRIP ANTIMIC 1/2X4 (GAUZE/BANDAGES/DRESSINGS) ×1 IMPLANT
COVER SURGICAL LIGHT HANDLE (MISCELLANEOUS) ×2 IMPLANT
COVER WAND RF STERILE (DRAPES) IMPLANT
DECANTER SPIKE VIAL GLASS SM (MISCELLANEOUS) ×2 IMPLANT
DEVICE SUT QUICK LOAD TK 5 (STAPLE) IMPLANT
DEVICE SUT TI-KNOT TK 5X26 (MISCELLANEOUS) IMPLANT
DRAPE UTILITY XL STRL (DRAPES) ×4 IMPLANT
ELECT REM PT RETURN 15FT ADLT (MISCELLANEOUS) ×2 IMPLANT
GAUZE SPONGE 4X4 12PLY STRL (GAUZE/BANDAGES/DRESSINGS) IMPLANT
GLOVE BIO SURGEON STRL SZ 6 (GLOVE) ×2 IMPLANT
GLOVE INDICATOR 6.5 STRL GRN (GLOVE) ×2 IMPLANT
GOWN STRL REUS W/TWL LRG LVL3 (GOWN DISPOSABLE) ×2 IMPLANT
GOWN STRL REUS W/TWL XL LVL3 (GOWN DISPOSABLE) ×4 IMPLANT
GRASPER SUT TROCAR 14GX15 (MISCELLANEOUS) ×2 IMPLANT
HOVERMATT SINGLE USE (MISCELLANEOUS) ×2 IMPLANT
KIT BASIN (CUSTOM PROCEDURE TRAY) ×2 IMPLANT
KIT TURNOVER KIT A (KITS) IMPLANT
MARKER SKIN DUAL TIP RULER LAB (MISCELLANEOUS) ×2 IMPLANT
NDL SPNL 22GX3.5 QUINCKE BK (NEEDLE) ×1 IMPLANT
NEEDLE SPNL 22GX3.5 QUINCKE BK (NEEDLE) ×2 IMPLANT
PACK UNIVERSAL I (CUSTOM PROCEDURE TRAY) ×2 IMPLANT
PENCIL SMOKE EVACUATOR (MISCELLANEOUS) IMPLANT
RELOAD ENDO STITCH (ENDOMECHANICALS) IMPLANT
RELOAD STAPLE 60 3.6 BLU REG (STAPLE) ×1 IMPLANT
RELOAD STAPLE 60 3.8 GOLD REG (STAPLE) ×1 IMPLANT
RELOAD STAPLE 60 4.1 GRN THCK (STAPLE) IMPLANT
RELOAD STAPLER BLUE 60MM (STAPLE) ×2 IMPLANT
RELOAD STAPLER GOLD 60MM (STAPLE) ×1 IMPLANT
RELOAD STAPLER GREEN 60MM (STAPLE) IMPLANT
RELOAD SUT TRIPLE-STITCH 2-0 (ENDOMECHANICALS) IMPLANT
SCISSORS LAP 5X45 EPIX DISP (ENDOMECHANICALS) ×2 IMPLANT
SET IRRIG TUBING LAPAROSCOPIC (IRRIGATION / IRRIGATOR) ×2 IMPLANT
SET TUBE SMOKE EVAC HIGH FLOW (TUBING) ×2 IMPLANT
SHEARS HARMONIC ACE PLUS 45CM (MISCELLANEOUS) ×2 IMPLANT
SLEEVE ADV FIXATION 5X100MM (TROCAR) ×4 IMPLANT
SLEEVE GASTRECTOMY 40FR VISIGI (MISCELLANEOUS) ×2 IMPLANT
SOL ANTI FOG 6CC (MISCELLANEOUS) ×1 IMPLANT
SOLUTION ANTI FOG 6CC (MISCELLANEOUS) ×1
SPONGE LAP 18X18 RF (DISPOSABLE) ×2 IMPLANT
STAPLER ECHELON BIOABSB 60 FLE (MISCELLANEOUS) ×9 IMPLANT
STAPLER ECHELON LONG 60 440 (INSTRUMENTS) ×2 IMPLANT
STAPLER RELOAD BLUE 60MM (STAPLE) ×4
STAPLER RELOAD GOLD 60MM (STAPLE) ×2
STAPLER RELOAD GREEN 60MM (STAPLE)
STRIP CLOSURE SKIN 1/2X4 (GAUZE/BANDAGES/DRESSINGS) ×2 IMPLANT
SUT MNCRL AB 4-0 PS2 18 (SUTURE) ×3 IMPLANT
SUT SURGIDAC NAB ES-9 0 48 120 (SUTURE) IMPLANT
SUT VICRYL 0 TIES 12 18 (SUTURE) ×2 IMPLANT
SYR 10ML ECCENTRIC (SYRINGE) ×2 IMPLANT
SYR 20ML LL LF (SYRINGE) ×2 IMPLANT
SYR 50ML LL SCALE MARK (SYRINGE) ×2 IMPLANT
TOWEL OR 17X26 10 PK STRL BLUE (TOWEL DISPOSABLE) ×2 IMPLANT
TOWEL OR NON WOVEN STRL DISP B (DISPOSABLE) ×2 IMPLANT
TROCAR ADV FIXATION 5X100MM (TROCAR) ×2 IMPLANT
TROCAR BLADELESS 15MM (ENDOMECHANICALS) ×2 IMPLANT
TROCAR BLADELESS OPT 5 100 (ENDOMECHANICALS) ×2 IMPLANT
TUBING CONNECTING 10 (TUBING) ×2 IMPLANT
TUBING ENDO SMARTCAP (MISCELLANEOUS) ×2 IMPLANT

## 2019-08-31 NOTE — H&P (Signed)
Surgical H&P  Chief Complaint: obesity  HPI: 39 year old gentleman presents for follow-up of surgical management of severe obesity. He was initially evaluated by my partner Dr. Ezzard Standing last year. He has completed the preoperative pathway for sleep gastrectomy with no barriers identified. He is ready to proceed with surgery. He denies any changes in his health in the last year.  Initial visit 07/03/18, Dr. Ezzard Standing: The patient is a 39 year old male who presents with a complaint of weight loss surgery.  The PCP is Dr. Gayla Doss  The patient was referred by Dr. Gayla Doss  He comes by himself.  [corona virus days]  He is interested in weight loss surgery. He went to an information with Dr. Sheliah Hatch around last October (2019).  He has seen nutritionist for his diabetes and worked on weight loss through her. He has tried to decrease his intake and go low calorie. He has not tried any name diets. He has not been on any medication for weight loss.  He is interested in the sleeve gastrectomy. He has a coworker who has had weight loss surgery, though he is uncertain which operation he had. I talked about our bariatric support group as an option to me people with surgery.  Per the 1991 NIH Consensus Statement, the patient is a candidate for bariatric surgery. The patient attended our initial information session and reviewed the types of bariatric surgery.  The patient is interested in the sleeve gastrectomy. I discussed with the patient the indications and risks of bariatric surgery. The potential risks of surgery include, but are not limited to, bleeding, infection, leak from the bowel, DVT and PE, open surgery, long term nutrition consequences, and death. The patient understands the importance of compliance and long term follow-up with our group after surgery.  From here we will obtain lab tests, x-rays, nutrition consult, and psych consult.  PLan: 1) Update labs and x-rays, 2) Psych and nutrtion consultation, 3)  EMMI and consent for sleeve  Review of Systems as stated in this history (HPI) or in the review of systems. Otherwise all other 12 point ROS are negative  Past Medical History:  1. Morbid obesity  Initial weight - 316, BMI - 48  2. DM x 2 years  Last HgbA1C - 07/08/2017 - 6.3  3. Gout  Social History:  Unmarried  No children  Works for the [employer]  No Known Allergies      Past Medical History:  Diagnosis Date  . Diabetes mellitus without complication (HCC)   . Gout         Past Surgical History:  Procedure Laterality Date  . EAR TUBE REMOVAL    . WISDOM TOOTH EXTRACTION          Family History  Problem Relation Age of Onset  . Amblyopia Neg Hx   . Blindness Neg Hx   . Cataracts Neg Hx   . Glaucoma Neg Hx   . Macular degeneration Neg Hx   . Retinal detachment Neg Hx   . Strabismus Neg Hx   . Retinitis pigmentosa Neg Hx    Social History        Socioeconomic History  . Marital status: Single    Spouse name: Not on file  . Number of children: Not on file  . Years of education: Not on file  . Highest education level: Not on file  Occupational History  . Not on file  Tobacco Use  . Smoking status: Never Smoker  .  Smokeless tobacco: Never Used  Substance and Sexual Activity  . Alcohol use: No  . Drug use: No  . Sexual activity: Not on file  Other Topics Concern  . Not on file  Social History Narrative  . Not on file   Social Determinants of Health      Financial Resource Strain:   . Difficulty of Paying Living Expenses: Not on file  Food Insecurity:   . Worried About Charity fundraiser in the Last Year: Not on file  . Ran Out of Food in the Last Year: Not on file  Transportation Needs:   . Lack of Transportation (Medical): Not on file  . Lack of Transportation (Non-Medical): Not on file  Physical Activity:   . Days of Exercise per Week: Not on file  . Minutes of Exercise per Session: Not on file  Stress:   . Feeling of Stress : Not on file   Social Connections:   . Frequency of Communication with Friends and Family: Not on file  . Frequency of Social Gatherings with Friends and Family: Not on file  . Attends Religious Services: Not on file  . Active Member of Clubs or Organizations: Not on file  . Attends Archivist Meetings: Not on file  . Marital Status: Not on file         Current Outpatient Medications on File Prior to Visit  Medication Sig Dispense Refill  . allopurinol (ZYLOPRIM) 300 MG tablet TAKE 1 TABLET(300 MG) BY MOUTH DAILY 90 tablet 1  . metFORMIN (GLUCOPHAGE) 1000 MG tablet TAKE 1 TABLET(1000 MG) BY MOUTH TWICE DAILY WITH A MEAL 180 tablet 0  . olmesartan (BENICAR) 5 MG tablet TAKE 1 TABLET(5 MG) BY MOUTH DAILY 90 tablet 1  . Vitamin D, Ergocalciferol, (DRISDOL) 1.25 MG (50000 UT) CAPS capsule TAKE 1 CAPSULE BY MOUTH EVERY 7 DAYS 5 capsule 2   No current facility-administered medications on file prior to visit.   Review of Systems: a complete, 10pt review of systems was completed with pertinent positives and negatives as documented in the HPI  Physical Exam:  Vitals  Weight: 315.8 lb Height: 68 in  Body Surface Area: 2.48 m Body Mass Index: 48.02 kg/m  Temp.: 97 F Pulse: 101 (Regular)  BP: 128/86 (Sitting, Left Arm, Standard)  Alert and well-appearing. Unlabored respirations  CBC Latest Ref Rng & Units 06/11/2019 01/13/2018 07/08/2017  WBC 4.0 - 10.5 K/uL 6.5 6.3 6.9  Hemoglobin 13.0 - 17.0 g/dL 14.8 14.2 14.6  Hematocrit 39.0 - 52.0 % 43.9 42.4 43.8  Platelets 150.0 - 400.0 K/uL 259.0 242.0 244.0   CMP Latest Ref Rng & Units 06/11/2019 09/08/2018 01/13/2018  Glucose 70 - 99 mg/dL 153(H) 158(H) 129(H)  BUN 6 - 23 mg/dL 14 13 14   Creatinine 0.40 - 1.50 mg/dL 0.87 0.84 0.81  Sodium 135 - 145 mEq/L 136 138 138  Potassium 3.5 - 5.1 mEq/L 4.5 3.9 4.3  Chloride 96 - 112 mEq/L 101 101 103  CO2 19 - 32 mEq/L 29 30 29   Calcium 8.4 - 10.5 mg/dL 9.2 9.0 9.3  Total Protein 6.0 - 8.3 g/dL 7.1 6.9 7.1   Total Bilirubin 0.2 - 1.2 mg/dL 0.6 0.5 0.4  Alkaline Phos 39 - 117 U/L 76 73 75  AST 0 - 37 U/L 23 21 26   ALT 0 - 53 U/L 41 36 34   Recent Labs    Imaging:  Imaging Results (Last 48 hours)    A/P: OBESITY, MORBID, BMI  40.0-49.9 (E66.01)  Story: He has completed the pathway with no barriers identified. We discussed the surgery including technical aspects, the risks of bleeding, infection, pain, scarring, injury to intra-abdominal structures, staple line leak or abscess, chronic abdominal pain or nausea, new onset or worsened GERD, DVT/PE, pneumonia, heart attack, stroke, death, failure to reach weight loss goals and weight regain, hernia. Discussed the typical peri-, and postoperative course. Discussed the importance of lifelong behavioral changes to combat the chronic and relapsing disease which is obesity. Questions local and answered. We will plan to proceed with surgery      Patient Active Problem List   Diagnosis Date Noted  . Acute viral syndrome 08/27/2018  . Encounter for health maintenance examination with abnormal findings 01/13/2018  . Need for influenza vaccination 01/13/2018  . Gout 01/13/2018  . Controlled type 2 diabetes mellitus without complication, without long-term current use of insulin (HCC) 07/08/2017  . Obesity 06/17/2017   Phylliss Blakes, MD  [facility]  See AMION to contact appropriate on-call provider

## 2019-08-31 NOTE — Anesthesia Preprocedure Evaluation (Addendum)
Anesthesia Evaluation  Patient identified by MRN, date of birth, ID band Patient awake    Reviewed: Allergy & Precautions, NPO status , Patient's Chart, lab work & pertinent test results  Airway Mallampati: III  TM Distance: >3 FB Neck ROM: Full    Dental no notable dental hx. (+) Dental Advisory Given   Pulmonary neg pulmonary ROS,    Pulmonary exam normal        Cardiovascular hypertension, Normal cardiovascular exam     Neuro/Psych negative neurological ROS     GI/Hepatic negative GI ROS, Neg liver ROS,   Endo/Other  diabetesMorbid obesity  Renal/GU negative Renal ROS     Musculoskeletal negative musculoskeletal ROS (+)   Abdominal   Peds  Hematology negative hematology ROS (+)   Anesthesia Other Findings   Reproductive/Obstetrics                            Anesthesia Physical Anesthesia Plan  ASA: III  Anesthesia Plan: General   Post-op Pain Management:    Induction: Intravenous  PONV Risk Score and Plan: 4 or greater and Ondansetron, Dexamethasone, Midazolam and Scopolamine patch - Pre-op  Airway Management Planned: Oral ETT  Additional Equipment:   Intra-op Plan:   Post-operative Plan: Extubation in OR  Informed Consent: I have reviewed the patients History and Physical, chart, labs and discussed the procedure including the risks, benefits and alternatives for the proposed anesthesia with the patient or authorized representative who has indicated his/her understanding and acceptance.     Dental advisory given  Plan Discussed with: Anesthesiologist and CRNA  Anesthesia Plan Comments:        Anesthesia Quick Evaluation

## 2019-08-31 NOTE — Op Note (Signed)
Preoperative diagnosis: laparoscopic sleeve gastrectomy  Postoperative diagnosis: Same   Procedure: Upper endoscopy   Surgeon: Feliciana Rossetti, M.D.  Anesthesia: Gen.   Indications for procedure: This patient was undergoing a laparoscopic sleeve gastrectomy.   Description of procedure: The endoscopy was placed in the mouth and into the oropharynx and under endoscopic vision it was advanced to the esophagogastric junction. The pouch was insufflated and no bleeding or bubbles were seen. The GEJ was identified at 39 cm from the teeth. There was some blood within the lumen but no active bleeding or leaks were detected. The scope was withdrawn without difficulty.   Feliciana Rossetti, M.D. General, Bariatric, & Minimally Invasive Surgery [facility]

## 2019-08-31 NOTE — Progress Notes (Signed)
PHARMACY CONSULT FOR:  Risk Assessment for Post-Discharge VTE Following Bariatric Surgery  Post-Discharge VTE Risk Assessment: This patient's probability of 30-day post-discharge VTE is increased due to the factors marked: X  Male    Age >/=60 years    BMI >/=50 kg/m2    CHF    Dyspnea at Rest    Paraplegia   X Non-gastric-band surgery    Operation Time >/=3 hr    Return to OR     Length of Stay >/= 3 d      Hx of VTE   Hypercoagulable condition   Significant venous stasis   Predicted probability of 30-day post-discharge VTE:  0.31%  Other patient-specific factors to consider: none   Recommendation for Discharge: No pharmacologic prophylaxis post-discharge   Wayne Castillo is a 39 y.o. male who underwent  Laparoscopic sleeve gastectomy on 08/31/2019    Case start: 14:07  Case end: 15:25   No Known Allergies  Patient Measurements: Height: 5\' 9"  (175.3 cm) Weight: 128.1 kg (282 lb 6.4 oz) IBW/kg (Calculated) : 70.7 Body mass index is 41.7 kg/m.  No results for input(s): WBC, HGB, HCT, PLT, APTT, CREATININE, LABCREA, CREATININE, CREAT24HRUR, MG, PHOS, ALBUMIN, PROT, ALBUMIN, AST, ALT, ALKPHOS, BILITOT, BILIDIR, IBILI in the last 72 hours. Estimated Creatinine Clearance: 164.3 mL/min (by C-G formula based on SCr of 0.79 mg/dL).    Past Medical History:  Diagnosis Date  . Arthritis   . Diabetes mellitus without complication (HCC)   . Gout   . History of kidney stones 2018  . Hypertension    mild      Medications Prior to Admission  Medication Sig Dispense Refill Last Dose  . allopurinol (ZYLOPRIM) 300 MG tablet TAKE 1 TABLET(300 MG) BY MOUTH DAILY (Patient taking differently: Take 300 mg by mouth daily. ) 90 tablet 1 08/31/2019 at 0800  . CALCIUM PO Take 1 tablet by mouth in the morning, at noon, and at bedtime.   Past Week at Unknown time  . metFORMIN (GLUCOPHAGE) 1000 MG tablet TAKE 1 TABLET(1000 MG) BY MOUTH TWICE DAILY WITH A MEAL 180 tablet 0 08/30/2019 at  1700  . Multiple Vitamins-Minerals (BARIATRIC MULTIVITAMINS/IRON PO) Take 2 tablets by mouth daily.   Past Week at Unknown time  . olmesartan (BENICAR) 5 MG tablet TAKE 1 TABLET(5 MG) BY MOUTH DAILY (Patient taking differently: Take 5 mg by mouth daily. ) 90 tablet 1 08/30/2019 at 0900  . Vitamin D, Ergocalciferol, (DRISDOL) 1.25 MG (50000 UNIT) CAPS capsule TAKE 1 CAPSULE BY MOUTH EVERY 7 DAYS (Patient taking differently: Take 50,000 Units by mouth every 7 (seven) days. ) 5 capsule 2 08/29/2019 at 0800    Caretha Rumbaugh, 08/31/2019, PharmD 08/31/2019,4:46 PM

## 2019-08-31 NOTE — Anesthesia Postprocedure Evaluation (Signed)
Anesthesia Post Note  Patient: Wayne Castillo  Procedure(s) Performed: LAPAROSCOPIC GASTRIC SLEEVE RESECTION, Upper Endo, ERAS Pathway (N/A )     Patient location during evaluation: PACU Anesthesia Type: General Level of consciousness: sedated Pain management: pain level controlled Vital Signs Assessment: post-procedure vital signs reviewed and stable Respiratory status: spontaneous breathing and respiratory function stable Cardiovascular status: stable Postop Assessment: no apparent nausea or vomiting Anesthetic complications: no    Last Vitals:  Vitals:   08/31/19 1615 08/31/19 1639  BP: 131/71 115/69  Pulse: 67 66  Resp: 10 16  Temp: 36.8 C 36.8 C  SpO2: 94% 97%    Last Pain:  Vitals:   08/31/19 1639  TempSrc: Oral  PainSc:                  [name] DANIEL

## 2019-08-31 NOTE — Discharge Instructions (Signed)
   GASTRIC BYPASS/SLEEVE  Home Care Instructions   These instructions are to help you care for yourself when you go home.  Call: If you have any problems. . Call [phone] and ask for the surgeon on call . If you need immediate help, come to the ER at [facility].  . Tell the ER staff that you are a new post-op gastric bypass or gastric sleeve patient   Signs and symptoms to report: . Severe vomiting or nausea o If you cannot keep down clear liquids for longer than 1 day, call your surgeon  . Abdominal pain that does not get better after taking your pain medication . Fever over 100.4 F with chills . Heart beating over 100 beats a minute . Shortness of breath at rest . Chest pain .  Redness, swelling, drainage, or foul odor at incision (surgical) sites .  If your incisions open or pull apart . Swelling or pain in calf (lower leg) . Diarrhea (Loose bowel movements that happen often), frequent watery, uncontrolled bowel movements . Constipation, (no bowel movements for 3 days) if this happens: Pick one o Milk of Magnesia, 2 tablespoons by mouth, 3 times a day for 2 days if needed o Stop taking Milk of Magnesia once you have a bowel movement o Call your doctor if constipation continues Or o Miralax  (instead of Milk of Magnesia) following the label instructions o Stop taking Miralax once you have a bowel movement o Call your doctor if constipation continues . Anything you think is not normal   Normal side effects after surgery: . Unable to sleep at night or unable to focus . Irritability or moody . Being tearful (crying) or depressed These are common complaints, possibly related to your anesthesia medications that put you to sleep, stress of surgery, and change in lifestyle.  This usually goes away a few weeks after surgery.  If these feelings continue, call your primary care doctor.   Wound Care: You may have surgical glue, steri-strips, or staples over your incisions after  surgery . Surgical glue:  Looks like a clear film over your incisions and will wear off a little at a time . Steri-strips: Strips of tape over your incisions. You may notice a yellowish color on the skin under the steri-strips. This is used to make the   steri-strips stick better. Do not pull the steri-strips off - let them fall off . Staples: Staples may be removed before you leave the hospital o If you go home with staples, call [facility], [phone] at for an appointment with your surgeon's nurse to have staples removed 10 days after surgery. . Showering: You may shower two (2) days after your surgery unless your surgeon tells you differently o Wash gently around incisions with warm soapy water, rinse well, and gently pat dry  o No tub baths until staples are removed, steri-strips fall off or glue is gone.    Medications: . Medications should be liquid or crushed if larger than the size of a dime . Extended release pills (medication that release a little bit at a time through the day) should NOT be crushed or cut. (examples include XL, ER, DR, SR) . Depending on the size and number of medications you take, you may need to space (take a few throughout the day)/change the time you take your medications so that you do not over-fill your pouch (smaller stomach) . Make sure you follow-up with your primary care doctor to   make medication changes needed during rapid weight loss and life-style changes . If you have diabetes, follow up with the doctor that orders your diabetes medication(s) within one week after surgery and check your blood sugar regularly. . Do not drive while taking prescription pain medication  . It is ok to take Tylenol by the bottle instructions with your pain medicine or instead of your pain medicine as needed.  DO NOT TAKE NSAIDS (EXAMPLES OF NSAIDS:  IBUPROFREN/ NAPROXEN)  Diet:                    First 2 Weeks  You will see the dietician t about two (2) weeks  after your surgery. The dietician will increase the types of foods you can eat if you are handling liquids well: . If you have severe vomiting or nausea and cannot keep down clear liquids lasting longer than 1 day, call your surgeon @ ([phone]) Protein Shake . Drink at least 2 ounces of shake 5-6 times per day . Each serving of protein shakes (usually 8 - 12 ounces) should have: o 15 grams of protein  o And no more than 5 grams of carbohydrate  . Goal for protein each day: o Men = 80 grams per day o Women = 60 grams per day . Protein powder may be added to fluids such as non-fat milk or Lactaid milk or unsweetened Soy/Almond milk (limit to 35 grams added protein powder per serving)  Hydration . Slowly increase the amount of water and other clear liquids as tolerated (See Acceptable Fluids) . Slowly increase the amount of protein shake as tolerated  .  Sip fluids slowly and throughout the day.  Do not use straws. . May use sugar substitutes in small amounts (no more than 6 - 8 packets per day; i.e. Splenda)  Fluid Goal . The first goal is to drink at least 8 ounces of protein shake/drink per day (or as directed by the nutritionist); some examples of protein shakes are Syntrax Nectar, Adkins Advantage, EAS Edge HP, and Unjury. See handout from pre-op Bariatric Education Class: o Slowly increase the amount of protein shake you drink as tolerated o You may find it easier to slowly sip shakes throughout the day o It is important to get your proteins in first . Your fluid goal is to drink 64 - 100 ounces of fluid daily o It may take a few weeks to build up to this . 32 oz (or more) should be clear liquids  And  . 32 oz (or more) should be full liquids (see below for examples) . Liquids should not contain sugar, caffeine, or carbonation  Clear Liquids: . Water or Sugar-free flavored water (i.e. Fruit H2O, Propel) . Decaffeinated coffee or tea (sugar-free) . Crystal Lite, Wyler's Lite,  Minute Maid Lite . Sugar-free Jell-O . Bouillon or broth . Sugar-free Popsicle:   *Less than 20 calories each; Limit 1 per day  Full Liquids: Protein Shakes/Drinks + 2 choices per day of other full liquids . Full liquids must be: o No More Than 15 grams of Carbs per serving  o No More Than 3 grams of Fat per serving . Strained low-fat cream soup (except Cream of Potato or Tomato) . Non-Fat milk . Fat-free Lactaid Milk . Unsweetened Soy Or Unsweetened Almond Milk . Low Sugar yogurt (Dannon Lite & Fit, Greek yogurt; Oikos Triple Zero; Chobani Simply 100; Yoplait 100 calorie Greek - No Fruit on the Bottom)    Vitamins   and Minerals . Start 1 day after surgery unless otherwise directed by your surgeon . Chewable Bariatric Specific Multivitamin / Multimineral Supplement with iron (Example: Bariatric Advantage Multi EA) . Chewable Calcium with Vitamin D-3 (Example: 3 Chewable Calcium Plus 600 with Vitamin D-3) o Take 500 mg three (3) times a day for a total of 1500 mg each day o Do not take all 3 doses of calcium at one time as it may cause constipation, and you can only absorb 500 mg  at a time  o Do not mix multivitamins containing iron with calcium supplements; take 2 hours apart . Menstruating women and those with a history of anemia (a blood disease that causes weakness) may need extra iron o Talk with your doctor to see if you need more iron . Do not stop taking or change any vitamins or minerals until you talk to your dietitian or surgeon . Your Dietitian and/or surgeon must approve all vitamin and mineral supplements   Activity and Exercise: Limit your physical activity as instructed by your doctor.  It is important to continue walking at home.  During this time, use these guidelines: . Do not lift anything greater than ten (10) pounds for at least two (2) weeks . Do not go back to work or drive until your surgeon says you can . You may have sex when you feel comfortable  o It is  VERY important for male patients to use a reliable birth control method; fertility often increases after surgery  o All hormonal birth control will be ineffective for 30 days after surgery due to medications given during surgery a barrier method must be used. o Do not get pregnant for at least 18 months . Start exercising as soon as your doctor tells you that you can o Make sure your doctor approves any physical activity . Start with a simple walking program . Walk 5-15 minutes each day, 7 days per week.  . Slowly increase until you are walking 30-45 minutes per day Consider joining our BELT program. [phone] or email belt@[email or web address]   Special Instructions Things to remember: . Use your CPAP when sleeping if this applies to you  . [facility] has two free Bariatric Surgery Support Groups that meet monthly o The 3rd Thursday of each month, 6 pm, [facility]  o The 2nd Friday of each month, 11:45 am in the private dining room in the basement of [facility] . It is very important to keep all follow up appointments with your surgeon, dietitian, primary care physician, and behavioral health practitioner . Routine follow up schedule with your surgeon include appointments at 2-3 weeks, 6-8 weeks, 6 months, and 1 year at a minimum.  Your surgeon may request to see you more often.   o After the first year, please follow up with your bariatric surgeon and dietitian at least once a year in order to maintain best weight loss results [facility]: [phone] [facility]: [phone] Bariatric Nurse Coordinator: [phone]      Reviewed and Endorsed  by [facility] Patient Education Committee, June, 2016 Edits Approved: Aug, 2018    

## 2019-08-31 NOTE — Op Note (Signed)
Operative Note  KAJ VASIL  [id number]  [id number]  08/31/2019   Surgeon: Berna Bue MD   Assistant: Feliciana Rossetti MD   Procedure performed: laparoscopic sleeve gastrectomy, upper endoscopy   Preop diagnosis: Morbid obesity Body mass index is 41.7 kg/m., hypertension, diabetes, gout, arthritis Post-op diagnosis/intraop findings: same   Specimens: fundus Retained items: none  EBL: 30 cc Complications: none   Description of procedure: After obtaining informed consent and administration of chemical DVT prophylaxis in holding, the patient was taken to the operating room and placed supine on operating room table where general endotracheal anesthesia was initiated, preoperative antibiotics were administered, SCDs applied, and a formal timeout was performed. The abdomen was prepped and draped in usual sterile fashion. Peritoneal access was gained using a Visiport technique in the left upper quadrant and insufflation to 15 mmHg ensued without issue. Gross inspection revealed no evidence of injury. Under direct visualization three more 5 mm trochars were placed in the right and left hemiabdomen and the 93mm trocar in the right paramedian upper abdomen. Bilateral laparoscopic assisted TAPS blocks were performed with Exparel diluted with 0.25 percent Marcaine. The patient was placed in steep Trendelenburg and the liver retractor was introduced through an incision in the upper midline and secured to the post externally to maintain the left lobe retracted anteriorly.  There was no hiatal hernia. Using the Harmonic scalpel, the greater curvature of the stomach was dissected away from the greater omentum and short gastric vessels were divided. This began 6 cm from the pylorus, and dissection proceeded until the left crus was clearly exposed. Esophageal fat pad was mobilized off the anterior stomach slightly. The 46 [place] VisiGi was then introduced and directed down towards the pylorus. This was  placed to suction against the lesser curve. Serial fires of the linear cutting stapler with seamguards were then employed to create our sleeve. The first fire used a green load and ensured adequate room at the angularis incisura. One gold load and then several blue loads were then employed to create a narrow tubular stomach up to the angle of His. The excised stomach was then removed through our 15 mm trocar site within an Endo Catch bag. The visigi was taken off of suction and a few puffs of air were introduced, inflating the sleeve. No bubbles were observed in the irrigation fluid around the stomach and the shape was noted to be evenly tubular without any narrowing at the angularis. The visigi was then removed. Upper endoscopy was performed by the assistant surgeon and the sleeve was noted to be airtight, the staple line was hemostatic, the lumen was evenly tubular without twisting, undue narrowing or angulation. Please see his separate note. The endoscope was removed. The 15 mm trocar site fascia in the right upper abdomen was closed a 0 Vicryl using the laparoscopic suture passer under direct visualization. The liver retractor was removed under direct visualization. There was bleeding from this trocar site which was controlled with a transfascial 0 vicryl using the PMI. The abdomen was then desufflated and all remaining trochars removed. The skin incisions were closed with subcuticular Monocryl; benzoin, Steri-Strips and Band-Aids were applied The patient was then awakened, extubated and taken to PACU in stable condition.     All counts were correct at the completion of the case.

## 2019-08-31 NOTE — Transfer of Care (Signed)
Immediate Anesthesia Transfer of Care Note  Patient: Wayne Castillo  Procedure(s) Performed: Procedure(s): LAPAROSCOPIC GASTRIC SLEEVE RESECTION, Upper Endo, ERAS Pathway (N/A)  Patient Location: PACU  Anesthesia Type:General  Level of Consciousness: Alert, Awake, Oriented  Airway & Oxygen Therapy: Patient Spontanous Breathing  Post-op Assessment: Report given to RN  Post vital signs: Reviewed and stable  Last Vitals:  Vitals:   08/31/19 1252  BP: 138/76  Pulse: 70  Resp: 18  Temp: 37.1 C  SpO2: 98%    Complications: No apparent anesthesia complications

## 2019-08-31 NOTE — Progress Notes (Signed)
Discussed post op day goals with patient including ambulation, IS, diet progression, pain, and nausea control.  BSTOP education provided including BSTOP information guide, "Guide for Pain Management after your Bariatric Procedure".  Questions answered. 

## 2019-08-31 NOTE — Progress Notes (Signed)
Started ice and water.

## 2019-08-31 NOTE — Anesthesia Procedure Notes (Signed)
Procedure Name: Intubation Date/Time: 08/31/2019 1:51 PM Performed by: West Pugh, CRNA Pre-anesthesia Checklist: Patient identified, Emergency Drugs available, Suction available, Patient being monitored and Timeout performed Patient Re-evaluated:Patient Re-evaluated prior to induction Oxygen Delivery Method: Circle system utilized Preoxygenation: Pre-oxygenation with 100% oxygen Induction Type: IV induction Ventilation: Mask ventilation without difficulty Laryngoscope Size: Mac and 3 Grade View: Grade II Tube type: Oral Tube size: 7.5 mm Number of attempts: 1 Airway Equipment and Method: Stylet Placement Confirmation: ETT inserted through vocal cords under direct vision,  positive ETCO2,  CO2 detector and breath sounds checked- equal and bilateral Secured at: 24 cm Tube secured with: Tape Dental Injury: Teeth and Oropharynx as per pre-operative assessment

## 2019-09-01 LAB — CBC WITH DIFFERENTIAL/PLATELET
Abs Immature Granulocytes: 0.02 10*3/uL (ref 0.00–0.07)
Basophils Absolute: 0 10*3/uL (ref 0.0–0.1)
Basophils Relative: 0 %
Eosinophils Absolute: 0 10*3/uL (ref 0.0–0.5)
Eosinophils Relative: 0 %
HCT: 41 % (ref 39.0–52.0)
Hemoglobin: 13.4 g/dL (ref 13.0–17.0)
Immature Granulocytes: 0 %
Lymphocytes Relative: 10 %
Lymphs Abs: 0.8 10*3/uL (ref 0.7–4.0)
MCH: 29.3 pg (ref 26.0–34.0)
MCHC: 32.7 g/dL (ref 30.0–36.0)
MCV: 89.5 fL (ref 80.0–100.0)
Monocytes Absolute: 0.4 10*3/uL (ref 0.1–1.0)
Monocytes Relative: 5 %
Neutro Abs: 6.6 10*3/uL (ref 1.7–7.7)
Neutrophils Relative %: 85 %
Platelets: 257 10*3/uL (ref 150–400)
RBC: 4.58 MIL/uL (ref 4.22–5.81)
RDW: 12.3 % (ref 11.5–15.5)
WBC: 7.8 10*3/uL (ref 4.0–10.5)
nRBC: 0 % (ref 0.0–0.2)

## 2019-09-01 LAB — COMPREHENSIVE METABOLIC PANEL
ALT: 37 U/L (ref 0–44)
AST: 25 U/L (ref 15–41)
Albumin: 3.9 g/dL (ref 3.5–5.0)
Alkaline Phosphatase: 65 U/L (ref 38–126)
Anion gap: 6 (ref 5–15)
BUN: 11 mg/dL (ref 6–20)
CO2: 26 mmol/L (ref 22–32)
Calcium: 8.5 mg/dL — ABNORMAL LOW (ref 8.9–10.3)
Chloride: 105 mmol/L (ref 98–111)
Creatinine, Ser: 0.81 mg/dL (ref 0.61–1.24)
GFR calc Af Amer: >60 mL/min (ref >60)
GFR calc non Af Amer: >60 mL/min (ref >60)
Glucose, Bld: 132 mg/dL — ABNORMAL HIGH (ref 70–99)
Potassium: 4.2 mmol/L (ref 3.5–5.1)
Sodium: 137 mmol/L (ref 135–145)
Total Bilirubin: 0.6 mg/dL (ref 0.3–1.2)
Total Protein: 6.6 g/dL (ref 6.5–8.1)

## 2019-09-01 LAB — GLUCOSE, CAPILLARY
Glucose-Capillary: 117 mg/dL — ABNORMAL HIGH (ref 70–99)
Glucose-Capillary: 117 mg/dL — ABNORMAL HIGH (ref 70–99)
Glucose-Capillary: 100 mg/dL — ABNORMAL HIGH (ref 70–99)

## 2019-09-01 LAB — SURGICAL PATHOLOGY

## 2019-09-01 LAB — MAGNESIUM: Magnesium: 2.2 mg/dL (ref 1.7–2.4)

## 2019-09-01 MED ORDER — DOCUSATE SODIUM 100 MG PO CAPS: 100.0000 mg | ORAL_CAPSULE | Freq: Two times a day (BID) | ORAL | 0 refills | Status: DC

## 2019-09-01 MED ORDER — ONDANSETRON 4 MG PO TBDP
4.0000 mg | ORAL_TABLET | Freq: Four times a day (QID) | ORAL | 0 refills | Status: DC | PRN
Start: 2019-09-01 — End: 2019-12-21

## 2019-09-01 MED ORDER — PANTOPRAZOLE SODIUM 40 MG PO TBEC: 40.0000 mg | DELAYED_RELEASE_TABLET | Freq: Every day | ORAL | 0 refills | Status: DC

## 2019-09-01 MED ORDER — GABAPENTIN 100 MG PO CAPS
200.0000 mg | ORAL_CAPSULE | Freq: Two times a day (BID) | ORAL | 0 refills | Status: DC
Start: 2019-09-01 — End: 2019-12-21

## 2019-09-01 MED ORDER — ACETAMINOPHEN 500 MG PO TABS
1000.0000 mg | ORAL_TABLET | Freq: Three times a day (TID) | ORAL | 0 refills | Status: AC
Start: 2019-09-01 — End: 2019-09-06

## 2019-09-01 MED ORDER — TRAMADOL HCL 50 MG PO TABS
50.0000 mg | ORAL_TABLET | Freq: Four times a day (QID) | ORAL | 0 refills | Status: DC | PRN
Start: 2019-09-01 — End: 2019-12-21

## 2019-09-01 NOTE — Progress Notes (Signed)
Patient alert and oriented, pain is controlled. Patient is tolerating fluids, advanced to protein shake today, patient is tolerating well.  Reviewed Gastric sleeve discharge instructions with patient and patient is able to articulate understanding.  Provided information on BELT program, Support Group and [facility] outpatient pharmacy. All questions answered, will continue to monitor.  Total fluid intake 740 Per dehydration protocol call back one week pos top

## 2019-09-01 NOTE — Discharge Summary (Signed)
Physician Discharge Summary  Wayne Castillo NTZ:[id number] DOB: May 12, 1980 DOA: 08/31/2019  PCP: Libby Maw, MD  Admit date: 08/31/2019 Discharge date: 09/01/2019   Recommendations for Outpatient Follow-up:   Follow-up Information     Clovis Riley, MD. Go on 09/30/2019.   Specialty: General Surgery Why: at 1050 am.  Please arrive 15 minutes prior to appointment time.  Thank you Contact information: [address] [place] [address] [phone]         Surgery, [place]. Go on 10/28/2019.   Specialty: General Surgery Why: at 1140 with Dr Romana Juniper.  Please arrive 15 minutes prior to appointment time.  Thank you. Contact information: [place] [address] [phone]           Discharge Diagnoses:  Active Problems:   Morbid obesity ([place])   Surgical Procedure: Laparoscopic Sleeve Gastrectomy, upper endoscopy  Discharge Condition: Good Disposition: Home  Diet recommendation: Postoperative sleeve gastrectomy diet (liquids only)  Filed Weights   08/31/19 1252  Weight: 128.1 kg     Hospital Course:  The patient was admitted for a planned laparoscopic sleeve gastrectomy. Please see operative note. Preoperatively the patient was given 5000 units of subcutaneous heparin for DVT prophylaxis. Postoperative prophylactic Lovenox dosing was started on the morning of postoperative day 1. ERAS protocol was used. On the evening of postoperative day 0, the patient was started on water and ice chips. On postoperative day 1 the patient had no fever or tachycardia and was tolerating water in their diet was gradually advanced throughout the day. The patient was ambulating without difficulty. Their vital signs are stable without fever or tachycardia. Their hemoglobin had remained stable. The patient had received discharge instructions and counseling. They were deemed stable for discharge and had met discharge  criteria  Vitals:   09/01/19 0629 09/01/19 0950  BP: 120/70 138/73  Pulse: 80 70  Resp: 18 18  Temp: 98.1 F (36.7 C) 98.4 F (36.9 C)  SpO2: 93% 97%    Alert and comfortable Unlabored respirations Abdomen soft, nontender. Incisions c/d/i no cellulitis or hematoma  Discharge Instructions  Discharge Instructions     Ambulate hourly while awake   Complete by: As directed    Call MD for:  difficulty breathing, headache or visual disturbances   Complete by: As directed    Call MD for:  persistant dizziness or light-headedness   Complete by: As directed    Call MD for:  persistant nausea and vomiting   Complete by: As directed    Call MD for:  redness, tenderness, or signs of infection (pain, swelling, redness, odor or green/yellow discharge around incision site)   Complete by: As directed    Call MD for:  severe uncontrolled pain   Complete by: As directed    Call MD for:  temperature >101 F   Complete by: As directed    Incentive spirometry   Complete by: As directed    Perform hourly while awake      Allergies as of 09/01/2019   No Known Allergies      Medication List     STOP taking these medications    metFORMIN 1000 MG tablet Commonly known as: GLUCOPHAGE       TAKE these medications    acetaminophen 500 MG tablet Commonly known as: TYLENOL Take 2 tablets (1,000 mg total) by mouth every 8 (eight) hours for 5 days.   allopurinol 300 MG tablet Commonly  known as: ZYLOPRIM TAKE 1 TABLET(300 MG) BY MOUTH DAILY What changed: See the new instructions.   BARIATRIC MULTIVITAMINS/IRON PO Take 2 tablets by mouth daily.   CALCIUM PO Take 1 tablet by mouth in the morning, at noon, and at bedtime.   docusate sodium 100 MG capsule Commonly known as: COLACE Take 1 capsule (100 mg total) by mouth 2 (two) times daily. Ok to stop taking or decrease to once daily if having loose bowel movements/ diarrhea   gabapentin 100 MG capsule Commonly known as:  NEURONTIN Take 2 capsules (200 mg total) by mouth every 12 (twelve) hours.   olmesartan 5 MG tablet Commonly known as: BENICAR TAKE 1 TABLET(5 MG) BY MOUTH DAILY What changed: See the new instructions.   ondansetron 4 MG disintegrating tablet Commonly known as: ZOFRAN-ODT Take 1 tablet (4 mg total) by mouth every 6 (six) hours as needed for nausea or vomiting.   pantoprazole 40 MG tablet Commonly known as: PROTONIX Take 1 tablet (40 mg total) by mouth daily.   traMADol 50 MG tablet Commonly known as: ULTRAM Take 1 tablet (50 mg total) by mouth every 6 (six) hours as needed (pain).   Vitamin D (Ergocalciferol) 1.25 MG (50000 UNIT) Caps capsule Commonly known as: DRISDOL TAKE 1 CAPSULE BY MOUTH EVERY 7 DAYS What changed: See the new instructions.       Follow-up Information     Clovis Riley, MD. Go on 09/30/2019.   Specialty: General Surgery Why: at 1050 am.  Please arrive 15 minutes prior to appointment time.  Thank you Contact information: [address] [place] [address] [phone]         Surgery, [place]. Go on 10/28/2019.   Specialty: General Surgery Why: at 1140 with Dr Romana Juniper.  Please arrive 15 minutes prior to appointment time.  Thank you. Contact information: [place] [address] [phone]             The results of significant diagnostics from this hospitalization (including imaging, microbiology, ancillary and laboratory) are listed below for reference.    Significant Diagnostic Studies: No results found.  Labs: Basic Metabolic Panel: Recent Labs  Lab 08/25/19 1554 09/01/19 0440  NA 136 137  K 4.2 4.2  CL 102 105  CO2 23 26  GLUCOSE 99 132*  BUN 24* 11  CREATININE 0.79 0.81  CALCIUM 9.3 8.5*  MG  --  2.2   Liver Function Tests: Recent Labs  Lab 08/25/19 1554 09/01/19 0440  AST 27 25  ALT 42 37  ALKPHOS 67 65  BILITOT 0.7 0.6  PROT 7.7 6.6  ALBUMIN  4.6 3.9    CBC: Recent Labs  Lab 08/25/19 1554 09/01/19 0440  WBC 7.3 7.8  NEUTROABS 4.7 6.6  HGB 14.3 13.4  HCT 43.8 41.0  MCV 88.0 89.5  PLT 275 257    CBG: Recent Labs  Lab 08/31/19 1952 08/31/19 2341 09/01/19 0419 09/01/19 0757 09/01/19 1153  GLUCAP 148* 141* 117* 117* 100*    Active Problems:   Morbid obesity ([place])    Signed:  Clovis Riley, [place] [facility] [facility], [place] 09/01/2019, 2:55 PM

## 2019-09-04 ENCOUNTER — Other Ambulatory Visit: Payer: Self-pay | Admitting: Family Medicine

## 2019-09-04 DIAGNOSIS — E119 Type 2 diabetes mellitus without complications: Secondary | ICD-10-CM

## 2019-09-04 DIAGNOSIS — M109 Gout, unspecified: Secondary | ICD-10-CM

## 2019-09-07 ENCOUNTER — Telehealth (HOSPITAL_COMMUNITY): Payer: Self-pay

## 2019-09-07 NOTE — Telephone Encounter (Signed)
Patient called to discuss post bariatric surgery follow up questions.  See below:   1.  Tell me about your pain and pain management?just took last tramadol, can utilize tylenol for discomfort reach out to [facility]   2.  Let's talk about fluid intake.  How much total fluid are you taking in?64 ounces  3.  How much protein have you taken in the last 2 days?90 grams of protein   4.  Have you had nausea?  Tell me about when have experienced nausea and what you did to help?denies nausea  5.  Has the frequency or color changed with your urine?light in color  6.  Tell me what your incisions look like?no problems  7.  Have you been passing gas? BM?passing gas had bm  8.  If a problem or question were to arise who would you call?  Do you know contact numbers for [facility], [facility], and [facility]?  Aware of all numbers  9.  How has the walking going? Walking around at home, moving around  10.  How are your vitamins and calcium going?  How are you taking them? mvi and calcium started

## 2019-09-08 ENCOUNTER — Other Ambulatory Visit: Payer: Self-pay

## 2019-09-09 ENCOUNTER — Ambulatory Visit: Payer: Federal, State, Local not specified - PPO | Admitting: Family Medicine

## 2019-09-09 ENCOUNTER — Encounter: Payer: Self-pay | Admitting: Family Medicine

## 2019-09-09 VITALS — BP 102/68 | HR 86 | Temp 96.7°F | Ht 69.0 in | Wt 274.2 lb

## 2019-09-09 DIAGNOSIS — E119 Type 2 diabetes mellitus without complications: Secondary | ICD-10-CM | POA: Diagnosis not present

## 2019-09-09 DIAGNOSIS — E559 Vitamin D deficiency, unspecified: Secondary | ICD-10-CM

## 2019-09-09 DIAGNOSIS — Z9889 Other specified postprocedural states: Secondary | ICD-10-CM | POA: Diagnosis not present

## 2019-09-09 LAB — CBC
HCT: 45.4 % (ref 39.0–52.0)
Hemoglobin: 15.2 g/dL (ref 13.0–17.0)
MCHC: 33.4 g/dL (ref 30.0–36.0)
MCV: 87.6 fl (ref 78.0–100.0)
Platelets: 235 10*3/uL (ref 150.0–400.0)
RBC: 5.18 Mil/uL (ref 4.22–5.81)
RDW: 13.4 % (ref 11.5–15.5)
WBC: 5.7 10*3/uL (ref 4.0–10.5)

## 2019-09-09 LAB — URINALYSIS, ROUTINE W REFLEX MICROSCOPIC
Hgb urine dipstick: NEGATIVE
Ketones, ur: 40 — AB
Leukocytes,Ua: NEGATIVE
Nitrite: NEGATIVE
RBC / HPF: NONE SEEN (ref 0–?)
Specific Gravity, Urine: >=1.03 — AB (ref 1.000–1.030)
Total Protein, Urine: NEGATIVE
Urine Glucose: NEGATIVE
Urobilinogen, UA: 0.2 (ref 0.0–1.0)
pH: 6 (ref 5.0–8.0)

## 2019-09-09 LAB — BASIC METABOLIC PANEL
BUN: 17 mg/dL (ref 6–23)
CO2: 29 mEq/L (ref 19–32)
Calcium: 9.1 mg/dL (ref 8.4–10.5)
Chloride: 100 mEq/L (ref 96–112)
Creatinine, Ser: 1 mg/dL (ref 0.40–1.50)
GFR: 82.88 mL/min (ref >60.00)
Glucose, Bld: 104 mg/dL — ABNORMAL HIGH (ref 70–99)
Potassium: 4.2 mEq/L (ref 3.5–5.1)
Sodium: 138 mEq/L (ref 135–145)

## 2019-09-09 LAB — HEMOGLOBIN A1C: Hgb A1c MFr Bld: 6.8 % — ABNORMAL HIGH (ref 4.6–6.5)

## 2019-09-09 LAB — VITAMIN D 25 HYDROXY (VIT D DEFICIENCY, FRACTURES): VITD: 48.68 ng/mL (ref 30.00–100.00)

## 2019-09-09 NOTE — Progress Notes (Signed)
Established Patient Office Visit  Subjective:  Patient ID: Wayne Castillo, male    DOB: 1981-03-13  Age: 39 y.o. MRN: [id number]  CC:  Chief Complaint  Patient presents with  . Follow-up    post op after gastric sleeve, surgery on 08/31/19. No concerns    HPI Wayne Castillo presents for postop follow-up status post gastric sleeve placement 6 days ago.  Patient has been doing well.  Denies significant abdominal pain nausea vomiting constipation or difficulty with urination.  Blood pressure was low in the hospital.  After dieting prior to the procedure he has been able to lose 15 to 20 pounds.  He was told to discontinue the Metformin in the hospital.  Past Medical History:  Diagnosis Date  . Arthritis   . Diabetes mellitus without complication (HCC)   . Gout   . History of kidney stones 2018  . Hypertension    mild     Past Surgical History:  Procedure Laterality Date  . EAR TUBE REMOVAL    . LAPAROSCOPIC GASTRIC SLEEVE RESECTION N/A 08/31/2019   Procedure: LAPAROSCOPIC GASTRIC SLEEVE RESECTION, Upper Endo, ERAS Pathway;  Surgeon: Berna Bue, MD;  Location: [facility];  Service: General;  Laterality: N/A;    Family History  Problem Relation Age of Onset  . Amblyopia Neg Hx   . Blindness Neg Hx   . Cataracts Neg Hx   . Glaucoma Neg Hx   . Macular degeneration Neg Hx   . Retinal detachment Neg Hx   . Strabismus Neg Hx   . Retinitis pigmentosa Neg Hx     Social History   Socioeconomic History  . Marital status: Single    Spouse name: Not on file  . Number of children: Not on file  . Years of education: Not on file  . Highest education level: Not on file  Occupational History  . Not on file  Tobacco Use  . Smoking status: Never Smoker  . Smokeless tobacco: Never Used  Substance and Sexual Activity  . Alcohol use: No  . Drug use: No  . Sexual activity: Not on file  Other Topics Concern  . Not on file  Social History Narrative  . Not on file   Social  Determinants of Health   Financial Resource Strain:   . Difficulty of Paying Living Expenses:   Food Insecurity:   . Worried About Programme researcher, broadcasting/film/video in the Last Year:   . Barista in the Last Year:   Transportation Needs:   . Freight forwarder (Medical):   Marland Kitchen Lack of Transportation (Non-Medical):   Physical Activity:   . Days of Exercise per Week:   . Minutes of Exercise per Session:   Stress:   . Feeling of Stress :   Social Connections:   . Frequency of Communication with Friends and Family:   . Frequency of Social Gatherings with Friends and Family:   . Attends Religious Services:   . Active Member of Clubs or Organizations:   . Attends Banker Meetings:   Marland Kitchen Marital Status:   Intimate Partner Violence:   . Fear of Current or Ex-Partner:   . Emotionally Abused:   Marland Kitchen Physically Abused:   . Sexually Abused:     Outpatient Medications Prior to Visit  Medication Sig Dispense Refill  . allopurinol (ZYLOPRIM) 300 MG tablet TAKE 1 TABLET(300 MG) BY MOUTH DAILY 90 tablet 1  . CALCIUM PO Take 1 tablet by  mouth in the morning, at noon, and at bedtime.    . gabapentin (NEURONTIN) 100 MG capsule Take 2 capsules (200 mg total) by mouth every 12 (twelve) hours. 20 capsule 0  . Multiple Vitamins-Minerals (BARIATRIC MULTIVITAMINS/IRON PO) Take 2 tablets by mouth daily.    . ondansetron (ZOFRAN-ODT) 4 MG disintegrating tablet Take 1 tablet (4 mg total) by mouth every 6 (six) hours as needed for nausea or vomiting. 20 tablet 0  . Vitamin D, Ergocalciferol, (DRISDOL) 1.25 MG (50000 UNIT) CAPS capsule TAKE 1 CAPSULE BY MOUTH EVERY 7 DAYS (Patient taking differently: Take 50,000 Units by mouth every 7 (seven) days. ) 5 capsule 2  . olmesartan (BENICAR) 5 MG tablet TAKE 1 TABLET(5 MG) BY MOUTH DAILY 90 tablet 1  . docusate sodium (COLACE) 100 MG capsule Take 1 capsule (100 mg total) by mouth 2 (two) times daily. Ok to stop taking or decrease to once daily if having loose  bowel movements/ diarrhea (Patient not taking: Reported on 09/09/2019) 30 capsule 0  . pantoprazole (PROTONIX) 40 MG tablet Take 1 tablet (40 mg total) by mouth daily. (Patient not taking: Reported on 09/09/2019) 90 tablet 0  . traMADol (ULTRAM) 50 MG tablet Take 1 tablet (50 mg total) by mouth every 6 (six) hours as needed (pain). (Patient not taking: Reported on 09/09/2019) 10 tablet 0   No facility-administered medications prior to visit.    No Known Allergies  ROS Review of Systems  Constitutional: Negative.  Negative for chills, diaphoresis, fatigue, fever and unexpected weight change.  HENT: Negative.   Eyes: Negative for photophobia and visual disturbance.  Respiratory: Negative.   Cardiovascular: Negative.   Gastrointestinal: Negative for abdominal distention, abdominal pain, anal bleeding, blood in stool, constipation, diarrhea, nausea and vomiting.  Endocrine: Negative for polyphagia and polyuria.  Genitourinary: Negative.   Musculoskeletal: Negative for gait problem and joint swelling.  Allergic/Immunologic: Negative for immunocompromised state.  Neurological: Negative for light-headedness and headaches.  Hematological: Does not bruise/bleed easily.  Psychiatric/Behavioral: Negative.       Objective:    Physical Exam  Constitutional: He is oriented to person, place, and time. He appears well-developed and well-nourished. No distress.  HENT:  Head: Normocephalic and atraumatic.  Right Ear: External ear normal.  Left Ear: External ear normal.  Eyes: Conjunctivae are normal. Right eye exhibits no discharge. Left eye exhibits no discharge. No scleral icterus.  Neck: No JVD present. No tracheal deviation present. No thyromegaly present.  Cardiovascular: Normal rate, regular rhythm and normal heart sounds.  Pulmonary/Chest: Effort normal and breath sounds normal. No stridor.  Abdominal: Soft. Bowel sounds are normal. He exhibits no distension. There is no abdominal tenderness.  There is no rebound and no guarding.    Musculoskeletal:        General: No edema.  Lymphadenopathy:    He has no cervical adenopathy.  Neurological: He is alert and oriented to person, place, and time.  Skin: Skin is warm and dry. He is not diaphoretic.  Psychiatric: He has a normal mood and affect. His behavior is normal.    BP 102/68   Pulse 86   Temp (!) 96.7 F (35.9 C) (Tympanic)   Ht 5\' 9"  (1.753 m)   Wt 274 lb 3.2 oz (124.4 kg)   SpO2 96%   BMI 40.49 kg/m  Wt Readings from Last 3 Encounters:  09/09/19 274 lb 3.2 oz (124.4 kg)  08/31/19 282 lb 6.4 oz (128.1 kg)  08/25/19 289 lb 8 oz (131.3 kg)  Health Maintenance Due  Topic Date Due  . PNEUMOCOCCAL POLYSACCHARIDE VACCINE AGE 78-64 HIGH RISK  Never done  . COVID-19 Vaccine (1) Never done  . OPHTHALMOLOGY EXAM  02/12/2019    There are no preventive care reminders to display for this patient.  Lab Results  Component Value Date   TSH 0.61 01/13/2018   Lab Results  Component Value Date   WBC 7.8 09/01/2019   HGB 13.4 09/01/2019   HCT 41.0 09/01/2019   MCV 89.5 09/01/2019   PLT 257 09/01/2019   Lab Results  Component Value Date   NA 137 09/01/2019   K 4.2 09/01/2019   CO2 26 09/01/2019   GLUCOSE 132 (H) 09/01/2019   BUN 11 09/01/2019   CREATININE 0.81 09/01/2019   BILITOT 0.6 09/01/2019   ALKPHOS 65 09/01/2019   AST 25 09/01/2019   ALT 37 09/01/2019   PROT 6.6 09/01/2019   ALBUMIN 3.9 09/01/2019   CALCIUM 8.5 (L) 09/01/2019   ANIONGAP 6 09/01/2019   GFR 97.46 06/11/2019   Lab Results  Component Value Date   CHOL 169 06/11/2019   Lab Results  Component Value Date   HDL 41.50 06/11/2019   Lab Results  Component Value Date   LDLCALC 100 (H) 06/11/2019   Lab Results  Component Value Date   TRIG 137.0 06/11/2019   Lab Results  Component Value Date   CHOLHDL 4 06/11/2019   Lab Results  Component Value Date   HGBA1C 7.8 (H) 06/11/2019      Assessment & Plan:   Problem List  Items Addressed This Visit      Endocrine   Controlled type 2 diabetes mellitus without complication, without long-term current use of insulin (HCC) - Primary   Relevant Orders   CBC   Basic metabolic panel   Hemoglobin A1c   Urinalysis, Routine w reflex microscopic     Other   Post-operative state   Relevant Orders   CBC   Vitamin D deficiency   Relevant Orders   VITAMIN D 25 Hydroxy (Vit-D Deficiency, Fractures)      No orders of the defined types were placed in this encounter.   Follow-up: Return in about 3 months (around 12/10/2019), or check and record blood pressures..  Will hold olmesartan for now. Patient will check and record bps.   Libby Maw, MD

## 2019-09-14 ENCOUNTER — Other Ambulatory Visit: Payer: Self-pay

## 2019-09-14 ENCOUNTER — Encounter: Payer: Federal, State, Local not specified - PPO | Attending: Surgery | Admitting: Skilled Nursing Facility1

## 2019-09-14 DIAGNOSIS — E669 Obesity, unspecified: Secondary | ICD-10-CM | POA: Insufficient documentation

## 2019-09-15 NOTE — Progress Notes (Signed)
2 Week Post-Operative Nutrition Class   Patient was seen on 06/02/18 for Post-Operative Nutrition education at [facility]    Start weight at [facility]: 321 lbs (date: 08/25/2018) Today's weight: 317.5 lbs lbs Sleeve Sx date: 08/31/2019   Body Composition Scale Declined  Total Body Fat %   Visceral Fat   Fat-Free Mass %    Total Body Water %    Muscle-Mass lbs   Body Fat Displacement          Torso  lbs          Left Leg  lbs          Right Leg  lbs          Left Arm  lbs          Right Arm   lbs      The following the learning objectives were met by the patient during this course:  Identifies Phase 3 (Soft, High Proteins) Dietary Goals and will begin from 2 weeks post-operatively to 2 months post-operatively  Identifies appropriate sources of fluids and proteins   States protein recommendations and appropriate sources post-operatively  Identifies the need for appropriate texture modifications, mastication, and bite sizes when consuming solids  Identifies appropriate multivitamin and calcium sources post-operatively  Describes the need for physical activity post-operatively and will follow MD recommendations  States when to call healthcare provider regarding medication questions or post-operative complications   Handouts given during class include:  Phase 3A: Soft, High Protein Diet Handout   Follow-Up Plan: Patient will follow-up at [facility] in 6 weeks for 2 month post-op nutrition visit for diet advancement per MD.

## 2019-09-20 ENCOUNTER — Inpatient Hospital Stay: Payer: Federal, State, Local not specified - PPO | Admitting: Family Medicine

## 2019-09-20 ENCOUNTER — Telehealth: Payer: Self-pay | Admitting: Skilled Nursing Facility1

## 2019-09-20 NOTE — Telephone Encounter (Signed)
RD called pt to verify fluid intake once starting soft, solid proteins 2 week post-bariatric surgery.   Daily Fluid intake: 64+ Daily Protein intake: 90+  Concerns/issues:   No concerns

## 2019-09-22 ENCOUNTER — Other Ambulatory Visit: Payer: Self-pay | Admitting: Family Medicine

## 2019-09-22 DIAGNOSIS — E559 Vitamin D deficiency, unspecified: Secondary | ICD-10-CM

## 2019-10-26 ENCOUNTER — Encounter: Payer: Federal, State, Local not specified - PPO | Attending: Surgery | Admitting: Skilled Nursing Facility1

## 2019-10-26 ENCOUNTER — Other Ambulatory Visit: Payer: Self-pay

## 2019-10-26 DIAGNOSIS — E669 Obesity, unspecified: Secondary | ICD-10-CM | POA: Diagnosis not present

## 2019-10-26 NOTE — Progress Notes (Signed)
Bariatric Nutrition Follow-Up Visit Medical Nutrition Therapy   Pt given star previously: No  NUTRITION ASSESSMENT    Anthropometrics  Start weight at [facility]: 321 lbs (date: 08/25/2018) Today's weight: 259 lbs  Clinical  Medical hx:  Medications: see list Labs: A1C 6.8   Lifestyle & Dietary Hx  Pt states a [employer] breakfast platter made him sick stating he thinks it was too greasy and also tried a crunch wrap which made him sick.  Pt states he tried soda which made him sick. Dietitian advised pt to not eat outside of the phases in order to preserve his health.   Estimated daily fluid intake: 135 oz Estimated daily protein intake: 80+ g Supplements: bari advantage and calcium Current average weekly physical activity: walking job, gym 3 days a week 60-120 minutes not out of breath but sweaty   24-Hr Dietary Recall First Meal: protein shake  Snack: cheese stick Second Meal: half cup cottage cheese and  yogurt  Snack: shrimp Third Meal: pulled pork Snack: yogurt  Beverages: flavored water, whole milk     Post-Op Goals/ Signs/ Symptoms Using straws: no Drinking while eating: no Chewing/swallowing difficulties: no Changes in vision: no Changes to mood/headaches: no Hair loss/changes to skin/nails: no Difficulty focusing/concentrating: no Sweating: no Dizziness/lightheadedness: no Palpitations: no Carbonated/caffeinated beverages: no N/V/D/C/Gas: no Abdominal pain: no Dumping syndrome: no    NUTRITION DIAGNOSIS  Overweight/obesity ([place]-3.3) related to past poor dietary habits and physical inactivity as evidenced by completed bariatric surgery and following dietary guidelines for continued weight loss and healthy nutrition status.     NUTRITION INTERVENTION Nutrition counseling (C-1) and education (E-2) to facilitate bariatric surgery goals, including: . Diet advancement to the next phase (phase 4) now including non starchy  . The importance of consuming adequate  calories as well as certain nutrients daily due to the body's need for essential vitamins, minerals, and fats . The importance of daily physical activity and to reach a goal of at least 150 minutes of moderate to vigorous physical activity weekly (or as directed by their physician) due to benefits such as increased musculature and improved lab values . The importance of intuitive eating specifically learning hunger-satiety cues and understanding the importance of learning a new body  Goals: -Continue to aim for a minimum of 64 fluid ounces 7 days a week with at least 30 ounces being plain water -Eat non-starchy vegetables 2 times a day 7 days a week -Start out with soft cooked vegetables today and tomorrow; if tolerated begin to eat raw vegetables or cooked including salads -Eat your 3 ounces of protein first then start in on your non-starchy vegetables; once you understand how much of your meal leads to satisfaction and not full while still eating 3 ounces of protein and non-starchy vegetables you can eat them in any order  -Continue to aim for 30 minutes of activity at least 5 times a week -Do NOT cook with/add to your food: alfredo sauce, cheese sauce, barbeque sauce, ketchup, fat back, butter, bacon grease, grease, Crisco, OR SUGAR  Handouts Provided Include   Phase 4  Learning Style & Readiness for Change Teaching method utilized: Visual & Auditory  Demonstrated degree of understanding via: Teach Back  Barriers to learning/adherence to lifestyle change: contemplative stage of change  RD's Notes for Next Visit . Assess adherence to pt chosen goals   MONITORING & EVALUATION Dietary intake, weekly physical activity, body weight  Next Steps Patient is to follow-up in 2 months

## 2019-12-13 ENCOUNTER — Ambulatory Visit: Payer: Federal, State, Local not specified - PPO | Admitting: Family Medicine

## 2019-12-14 ENCOUNTER — Ambulatory Visit: Payer: Federal, State, Local not specified - PPO | Admitting: Family Medicine

## 2019-12-21 ENCOUNTER — Other Ambulatory Visit: Payer: Self-pay

## 2019-12-21 ENCOUNTER — Encounter: Payer: Federal, State, Local not specified - PPO | Attending: Surgery | Admitting: Skilled Nursing Facility1

## 2019-12-21 ENCOUNTER — Encounter: Payer: Self-pay | Admitting: Family Medicine

## 2019-12-21 ENCOUNTER — Ambulatory Visit: Payer: Federal, State, Local not specified - PPO | Admitting: Family Medicine

## 2019-12-21 VITALS — BP 118/78 | HR 71 | Temp 96.3°F | Ht 69.0 in | Wt 254.4 lb

## 2019-12-21 DIAGNOSIS — E559 Vitamin D deficiency, unspecified: Secondary | ICD-10-CM

## 2019-12-21 DIAGNOSIS — E119 Type 2 diabetes mellitus without complications: Secondary | ICD-10-CM | POA: Diagnosis not present

## 2019-12-21 DIAGNOSIS — Z9889 Other specified postprocedural states: Secondary | ICD-10-CM

## 2019-12-21 DIAGNOSIS — Z9884 Bariatric surgery status: Secondary | ICD-10-CM | POA: Diagnosis not present

## 2019-12-21 DIAGNOSIS — E669 Obesity, unspecified: Secondary | ICD-10-CM | POA: Insufficient documentation

## 2019-12-21 DIAGNOSIS — M109 Gout, unspecified: Secondary | ICD-10-CM

## 2019-12-21 LAB — MICROALBUMIN / CREATININE URINE RATIO
Creatinine,U: 166.4 mg/dL
Microalb Creat Ratio: 0.6 mg/g (ref 0.0–30.0)
Microalb, Ur: 1.1 mg/dL (ref 0.0–1.9)

## 2019-12-21 LAB — BASIC METABOLIC PANEL
BUN: 14 mg/dL (ref 6–23)
CO2: 27 mEq/L (ref 19–32)
Calcium: 9.7 mg/dL (ref 8.4–10.5)
Chloride: 103 mEq/L (ref 96–112)
Creatinine, Ser: 0.76 mg/dL (ref 0.40–1.50)
GFR: 113.6 mL/min (ref >60.00)
Glucose, Bld: 97 mg/dL (ref 70–99)
Potassium: 4.2 mEq/L (ref 3.5–5.1)
Sodium: 139 mEq/L (ref 135–145)

## 2019-12-21 LAB — URINALYSIS, ROUTINE W REFLEX MICROSCOPIC
Bilirubin Urine: NEGATIVE
Hgb urine dipstick: NEGATIVE
Ketones, ur: NEGATIVE
Leukocytes,Ua: NEGATIVE
Nitrite: NEGATIVE
RBC / HPF: NONE SEEN (ref 0–?)
Specific Gravity, Urine: >=1.03 — AB (ref 1.000–1.030)
Total Protein, Urine: NEGATIVE
Urine Glucose: NEGATIVE
Urobilinogen, UA: 0.2 (ref 0.0–1.0)
pH: 5.5 (ref 5.0–8.0)

## 2019-12-21 LAB — CBC
HCT: 44 % (ref 39.0–52.0)
Hemoglobin: 14.6 g/dL (ref 13.0–17.0)
MCHC: 33.1 g/dL (ref 30.0–36.0)
MCV: 89 fl (ref 78.0–100.0)
Platelets: 239 10*3/uL (ref 150.0–400.0)
RBC: 4.94 Mil/uL (ref 4.22–5.81)
RDW: 13.7 % (ref 11.5–15.5)
WBC: 5.7 10*3/uL (ref 4.0–10.5)

## 2019-12-21 LAB — VITAMIN B12: Vitamin B-12: 1465 pg/mL — ABNORMAL HIGH (ref 211–911)

## 2019-12-21 LAB — VITAMIN D 25 HYDROXY (VIT D DEFICIENCY, FRACTURES): VITD: 59.26 ng/mL (ref 30.00–100.00)

## 2019-12-21 LAB — HEMOGLOBIN A1C: Hgb A1c MFr Bld: 6 % (ref 4.6–6.5)

## 2019-12-21 LAB — URIC ACID: Uric Acid, Serum: 4.8 mg/dL (ref 4.0–7.8)

## 2019-12-21 NOTE — Progress Notes (Signed)
Established Patient Office Visit  Subjective:  Patient ID: Wayne Castillo, male    DOB: 01/20/1981  Age: 39 y.o. MRN: [id number]  CC:  Chief Complaint  Patient presents with  . Follow-up    6 month follow up on DM, patient had bariatric surgery in May 2021    HPI DELRON COMER presents for follow-up of diabetes, history of gout, hypertension and vitamin D deficiency.  Continues with 5000 international units of vitamin D daily.  Blood pressure is normalized off of medicines.  Rechecking hemoglobin A1c today.  Continues allopurinol 300 mg daily.  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  He is almost 4 months status post gastric sleeve surgery.  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  Past Medical History:  Diagnosis Date  . Arthritis   . Diabetes mellitus without complication (HCC)   . Gout   . History of kidney stones 2018  . Hypertension    mild     Past Surgical History:  Procedure Laterality Date  . EAR TUBE REMOVAL    . LAPAROSCOPIC GASTRIC SLEEVE RESECTION N/A 08/31/2019   Procedure: LAPAROSCOPIC GASTRIC SLEEVE RESECTION, Upper Endo, ERAS Pathway;  Surgeon: Berna Bue, MD;  Location: [facility];  Service: General;  Laterality: N/A;    Family History  Problem Relation Age of Onset  . Amblyopia Neg Hx   . Blindness Neg Hx   . Cataracts Neg Hx   . Glaucoma Neg Hx   . Macular degeneration Neg Hx   . Retinal detachment Neg Hx   . Strabismus Neg Hx   . Retinitis pigmentosa Neg Hx     Social History   Socioeconomic History  . Marital status: Single    Spouse name: Not on file  . Number of children: Not on file  . Years of education: Not on file  . Highest education level: Not on file  Occupational History  . Not on file  Tobacco Use  . Smoking status: Never Smoker  . Smokeless tobacco: Never Used  Vaping Use  . Vaping Use: Never used  Substance and Sexual Activity  . Alcohol use: No  . Drug use: No  . Sexual activity:  Not on file  Other Topics Concern  . Not on file  Social History Narrative  . Not on file   Social Determinants of Health   Financial Resource Strain:   . Difficulty of Paying Living Expenses: Not on file  Food Insecurity:   . Worried About Programme researcher, broadcasting/film/video in the Last Year: Not on file  . Ran Out of Food in the Last Year: Not on file  Transportation Needs:   . Lack of Transportation (Medical): Not on file  . Lack of Transportation (Non-Medical): Not on file  Physical Activity:   . Days of Exercise per Week: Not on file  . Minutes of Exercise per Session: Not on file  Stress:   . Feeling of Stress : Not on file  Social Connections:   . Frequency of Communication with Friends and Family: Not on file  . Frequency of Social Gatherings with Friends and Family: Not on file  . Attends Religious Services: Not on file  . Active Member of Clubs or Organizations: Not on file  . Attends Banker Meetings: Not on file  . Marital Status: Not on file  Intimate Partner Violence:   . Fear of Current or Ex-Partner: Not on file  . Emotionally Abused:  Not on file  . Physically Abused: Not on file  . Sexually Abused: Not on file    Outpatient Medications Prior to Visit  Medication Sig Dispense Refill  . allopurinol (ZYLOPRIM) 300 MG tablet TAKE 1 TABLET(300 MG) BY MOUTH DAILY 90 tablet 1  . CALCIUM PO Take 1 tablet by mouth in the morning, at noon, and at bedtime.    . Multiple Vitamins-Minerals (BARIATRIC MULTIVITAMINS/IRON PO) Take 2 tablets by mouth daily.    . Vitamin D, Ergocalciferol, (DRISDOL) 1.25 MG (50000 UNIT) CAPS capsule TAKE 1 CAPSULE BY MOUTH EVERY 7 DAYS (Patient taking differently: Take 50,000 Units by mouth every 7 (seven) days. ) 5 capsule 2  . docusate sodium (COLACE) 100 MG capsule Take 1 capsule (100 mg total) by mouth 2 (two) times daily. Ok to stop taking or decrease to once daily if having loose bowel movements/ diarrhea (Patient not taking: Reported on  09/09/2019) 30 capsule 0  . gabapentin (NEURONTIN) 100 MG capsule Take 2 capsules (200 mg total) by mouth every 12 (twelve) hours. 20 capsule 0  . ondansetron (ZOFRAN-ODT) 4 MG disintegrating tablet Take 1 tablet (4 mg total) by mouth every 6 (six) hours as needed for nausea or vomiting. 20 tablet 0  . pantoprazole (PROTONIX) 40 MG tablet Take 1 tablet (40 mg total) by mouth daily. (Patient not taking: Reported on 12/21/2019) 90 tablet 0  . traMADol (ULTRAM) 50 MG tablet Take 1 tablet (50 mg total) by mouth every 6 (six) hours as needed (pain). (Patient not taking: Reported on 09/09/2019) 10 tablet 0   No facility-administered medications prior to visit.    No Known Allergies  ROS Review of Systems  Constitutional: Negative.   HENT: Negative.   Eyes: Negative for photophobia and visual disturbance.  Respiratory: Negative.   Cardiovascular: Negative.   Gastrointestinal: Negative.   Endocrine: Negative for polyphagia and polyuria.  Genitourinary: Negative.   Musculoskeletal: Negative for gait problem and joint swelling.  Skin: Negative for pallor and rash.  Allergic/Immunologic: Negative for immunocompromised state.  Neurological: Negative for weakness and numbness.  Hematological: Does not bruise/bleed easily.  Psychiatric/Behavioral: Negative.       Objective:    Physical Exam Vitals and nursing note reviewed.  Constitutional:      General: He is not in acute distress.    Appearance: Normal appearance. He is normal weight. He is not ill-appearing, toxic-appearing or diaphoretic.  HENT:     Head: Normocephalic and atraumatic.     Right Ear: Tympanic membrane, ear canal and external ear normal.     Left Ear: Tympanic membrane, ear canal and external ear normal.  Eyes:     General: No scleral icterus.       Right eye: No discharge.        Left eye: No discharge.     Conjunctiva/sclera: Conjunctivae normal.     Pupils: Pupils are equal, round, and reactive to light.   Cardiovascular:     Rate and Rhythm: Normal rate and regular rhythm.  Pulmonary:     Effort: Pulmonary effort is normal.     Breath sounds: Normal breath sounds.  Musculoskeletal:     Cervical back: No rigidity or tenderness.     Right lower leg: No edema.     Left lower leg: No edema.  Lymphadenopathy:     Cervical: No cervical adenopathy.  Neurological:     Mental Status: He is alert and oriented to person, place, and time.  Psychiatric:  Mood and Affect: Mood normal.        Behavior: Behavior normal.     BP 118/78   Pulse 71   Temp (!) 96.3 F (35.7 C) (Tympanic)   Ht 5\' 9"  (1.753 m)   Wt 254 lb 6.4 oz (115.4 kg)   SpO2 96%   BMI 37.57 kg/m  Wt Readings from Last 3 Encounters:  12/21/19 254 lb 6.4 oz (115.4 kg)  12/21/19 251 lb (113.9 kg)  10/26/19 259 lb 14.4 oz (117.9 kg)     Health Maintenance Due  Topic Date Due  . Hepatitis C Screening  Never done  . PNEUMOCOCCAL POLYSACCHARIDE VACCINE AGE 72-64 HIGH RISK  Never done  . OPHTHALMOLOGY EXAM  02/12/2019  . URINE MICROALBUMIN  09/08/2019  . INFLUENZA VACCINE  11/07/2019    There are no preventive care reminders to display for this patient.  Lab Results  Component Value Date   TSH 0.61 01/13/2018   Lab Results  Component Value Date   WBC 5.7 09/09/2019   HGB 15.2 09/09/2019   HCT 45.4 09/09/2019   MCV 87.6 09/09/2019   PLT 235.0 09/09/2019   Lab Results  Component Value Date   NA 138 09/09/2019   K 4.2 09/09/2019   CO2 29 09/09/2019   GLUCOSE 104 (H) 09/09/2019   BUN 17 09/09/2019   CREATININE 1.00 09/09/2019   BILITOT 0.6 09/01/2019   ALKPHOS 65 09/01/2019   AST 25 09/01/2019   ALT 37 09/01/2019   PROT 6.6 09/01/2019   ALBUMIN 3.9 09/01/2019   CALCIUM 9.1 09/09/2019   ANIONGAP 6 09/01/2019   GFR 82.88 09/09/2019   Lab Results  Component Value Date   CHOL 169 06/11/2019   Lab Results  Component Value Date   HDL 41.50 06/11/2019   Lab Results  Component Value Date    LDLCALC 100 (H) 06/11/2019   Lab Results  Component Value Date   TRIG 137.0 06/11/2019   Lab Results  Component Value Date   CHOLHDL 4 06/11/2019   Lab Results  Component Value Date   HGBA1C 6.8 (H) 09/09/2019      Assessment & Plan:   Problem List Items Addressed This Visit      Endocrine   Controlled type 2 diabetes mellitus without complication, without long-term current use of insulin (HCC) - Primary   Relevant Orders   Basic metabolic panel   Hemoglobin A1c   Urinalysis, Routine w reflex microscopic   Microalbumin / creatinine urine ratio     Other   Gout   Relevant Orders   Uric acid   Post-operative state   Vitamin D deficiency   Relevant Orders   VITAMIN D 25 Hydroxy (Vit-D Deficiency, Fractures)   History of bariatric surgery   Relevant Orders   CBC   Vitamin B12   Iron, TIBC and Ferritin Panel      No orders of the defined types were placed in this encounter.   Follow-up: Return in about 6 months (around 06/19/2020).   We will continue weight loss efforts.  May be able to consider discontinuation of allopurinol in the future. 06/21/2020, MD

## 2019-12-21 NOTE — Progress Notes (Signed)
Bariatric Nutrition Follow-Up Visit Medical Nutrition Therapy   Pt given star previously: No  NUTRITION ASSESSMENT    Anthropometrics  Start weight at [facility]: 321 lbs (date: 08/25/2018) Today's weight: 251 lbs  Clinical  Medical hx:  Medications: see list Labs: A1C 6.8   Lifestyle & Dietary Hx   Pt states he is upset with his plateau. Pt states he does eat bread such as a chic fila sandwich. Pt states he was able to include more plain water.  Dietitian advised pt his plateau is due to eating fast food and other calorically dense foods.  Pt states he is going to add in fruit.   Body Composition Scale 12/21/2019  Current Body Weight 251  Total Body Fat % 31.3  Visceral Fat 21  Fat-Free Mass % 68.6   Total Body Water % 49.6  Muscle-Mass lbs 47.2  BMI 36.8  Body Fat Displacement          Torso  lbs 48.7         Left Leg  lbs 9.7         Right Leg  lbs 9.7         Left Arm  lbs 4.8         Right Arm   lbs 4.8    Estimated daily fluid intake: 100+ oz Estimated daily protein intake: 80+ g Supplements: bari advantage and calcium Current average weekly physical activity: gym 2-4 times a week weight lifting, walking with work   24-Hr Dietary Recall First Meal: protein shake or chicken or steak + mushrooms and onions Snack: cheese stick Second Meal: half cup cottage cheese and  yogurt  Snack: shrimp Third Meal: pulled pork or chili + broccoli Snack: yogurt  Beverages: plain water, flavored water, whole milk     Post-Op Goals/ Signs/ Symptoms Using straws: no Drinking while eating: no Chewing/swallowing difficulties: no Changes in vision: no Changes to mood/headaches: no Hair loss/changes to skin/nails: no Difficulty focusing/concentrating: no Sweating: no Dizziness/lightheadedness: no Palpitations: no Carbonated/caffeinated beverages: no N/V/D/C/Gas: no Abdominal pain: no Dumping syndrome: no    NUTRITION DIAGNOSIS  Overweight/obesity ([place]-3.3) related to  past poor dietary habits and physical inactivity as evidenced by completed bariatric surgery and following dietary guidelines for continued weight loss and healthy nutrition status.     NUTRITION INTERVENTION Nutrition counseling (C-1) and education (E-2) to facilitate bariatric surgery goals, including: . Diet advancement to the next phase (phase 5) now including starchy  . The importance of consuming adequate calories as well as certain nutrients daily due to the body's need for essential vitamins, minerals, and fats . The importance of daily physical activity and to reach a goal of at least 150 minutes of moderate to vigorous physical activity weekly (or as directed by their physician) due to benefits such as increased musculature and improved lab values . The importance of intuitive eating specifically learning hunger-satiety cues and understanding the importance of learning a new body  Goals: -Continue to aim for a minimum of 64 fluid ounces 7 days a week with at least 30 ounces being plain water -Eat non-starchy vegetables 2 times a day 7 days a week -Eat your 3 ounces of protein first then start in on your non-starchy vegetables; once you understand how much of your meal leads to satisfaction and not full while still eating 3 ounces of protein and non-starchy vegetables you can eat them in any order  -Continue to aim for 30 minutes of activity at least 5  times a week -Do NOT cook with/add to your food: alfredo sauce, cheese sauce, barbeque sauce, ketchup, fat back, butter, bacon grease, grease, Crisco, OR SUGAR -Limit fruit to 3 servings per day -If you are going to have crackers for a snack do not have a starchy vegetable for dinner  Handouts Provided Include   Phase 5  Learning Style & Readiness for Change Teaching method utilized: Visual & Auditory  Demonstrated degree of understanding via: Teach Back  Barriers to learning/adherence to lifestyle change: contemplative stage of  change  RD's Notes for Next Visit . Assess adherence to pt chosen goals   MONITORING & EVALUATION Dietary intake, weekly physical activity, body weight  Next Steps Patient is to follow-up in 3 months

## 2019-12-22 LAB — IRON,TIBC AND FERRITIN PANEL
%SAT: 21 % (calc) (ref 20–48)
Ferritin: 61 ng/mL (ref 38–380)
Iron: 71 ug/dL (ref 50–180)
TIBC: 337 mcg/dL (calc) (ref 250–425)

## 2019-12-30 ENCOUNTER — Ambulatory Visit: Payer: Federal, State, Local not specified - PPO | Admitting: Family Medicine

## 2020-02-15 IMAGING — RF UPPER GI SERIES (WITHOUT KUB)
12 series · 12 of 12 positions shown · non-contrast
Comparison: None.

CLINICAL DATA: Preop bariatric surgery.

EXAM:
UPPER GI SERIES WITH KUB
TECHNIQUE: After obtaining a scout radiograph a routine upper GI series was
performed using thin barium
FLUOROSCOPY TIME:  Fluoroscopy Time:  54 seconds
Radiation Exposure Index (if provided by the fluoroscopic device):
75.7 mGy
Number of Acquired Spot Images: 0

[Series 1: t abdomen supine · 0.15mm/px · 1 of 1 slices shown (1 of 2)]
[im 1/1]
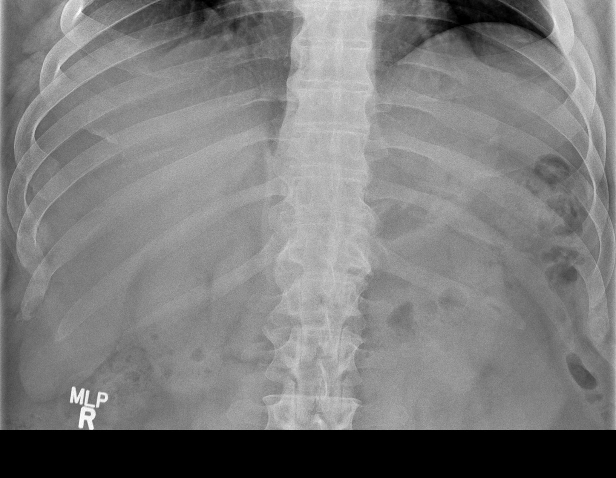

[Series 2: t abdomen supine · 0.15mm/px · 1 of 1 slices shown (2 of 2)]
[im 1/1]
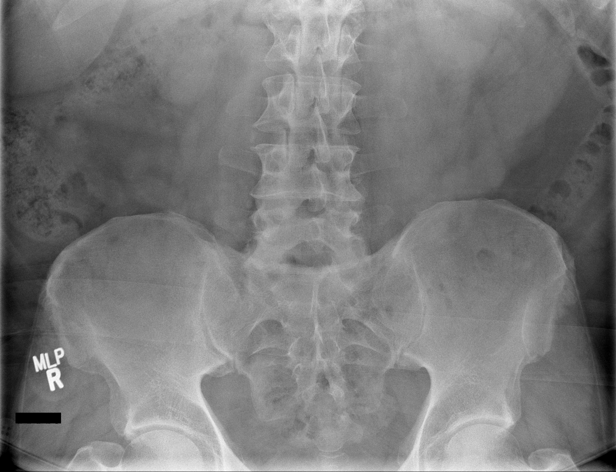

[Series 3: cp_standard · 0.26mm/px · 1 of 1 slices shown (1 of 10)]
[im 1/1]
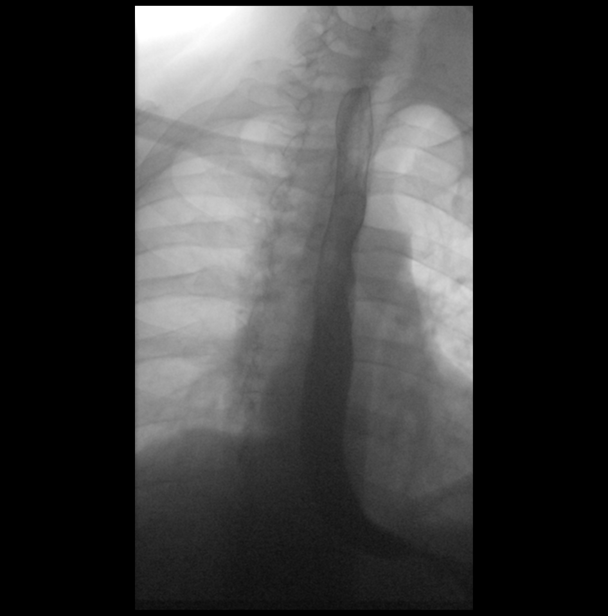

[Series 4: cp_standard · 0.26mm/px · 1 of 1 slices shown (2 of 10)]
[im 1/1]
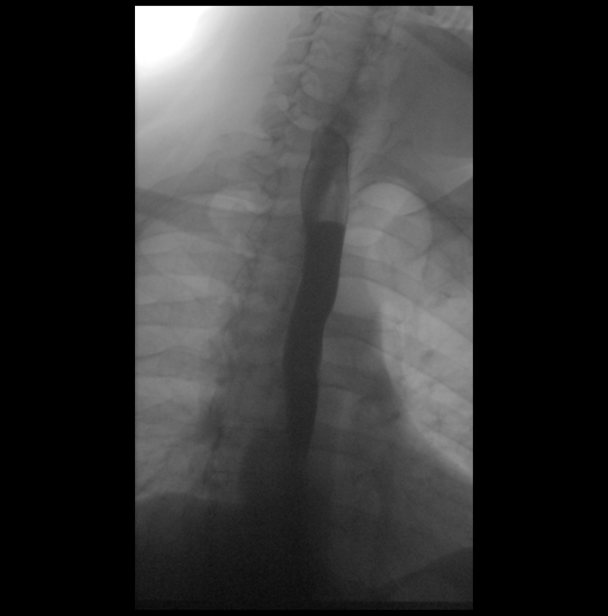

[Series 5: cp_standard · 0.26mm/px · 1 of 1 slices shown (3 of 10)]
[im 1/1]
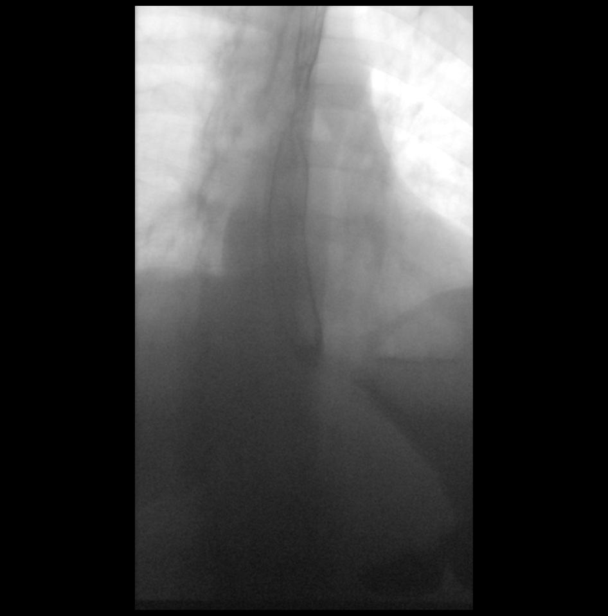

[Series 6: cp_standard · 0.26mm/px · 1 of 1 slices shown (4 of 10)]
[im 1/1]
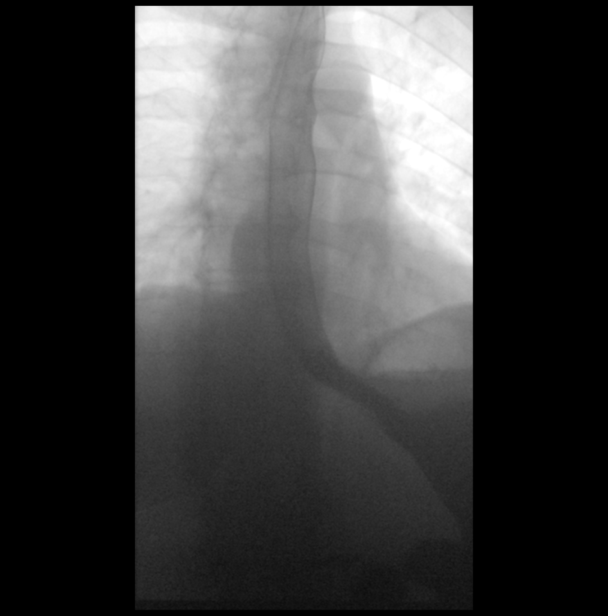

[Series 7: cp_standard · 0.17mm/px · 1 of 1 slices shown (5 of 10)]
[im 1/1]
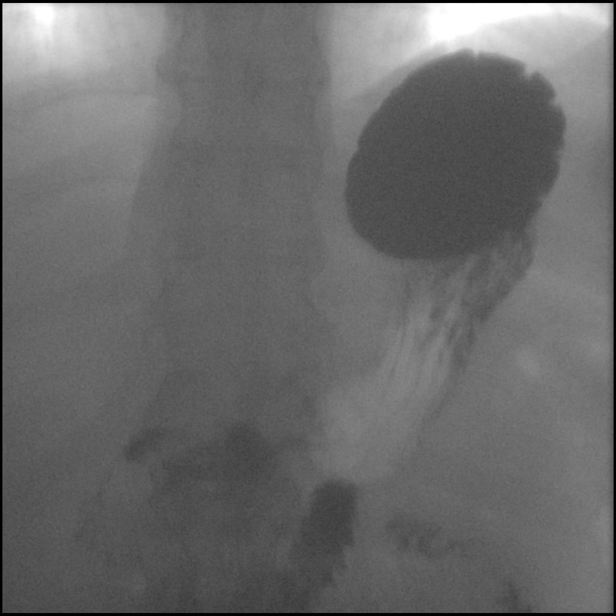

[Series 8: cp_standard · 0.17mm/px · 1 of 1 slices shown (6 of 10)]
[im 1/1]
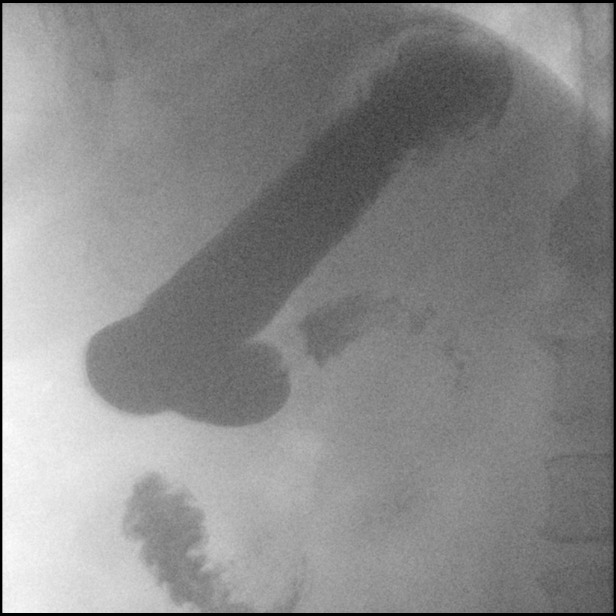

[Series 9: cp_standard · 0.17mm/px · 1 of 1 slices shown (7 of 10)]
[im 1/1]
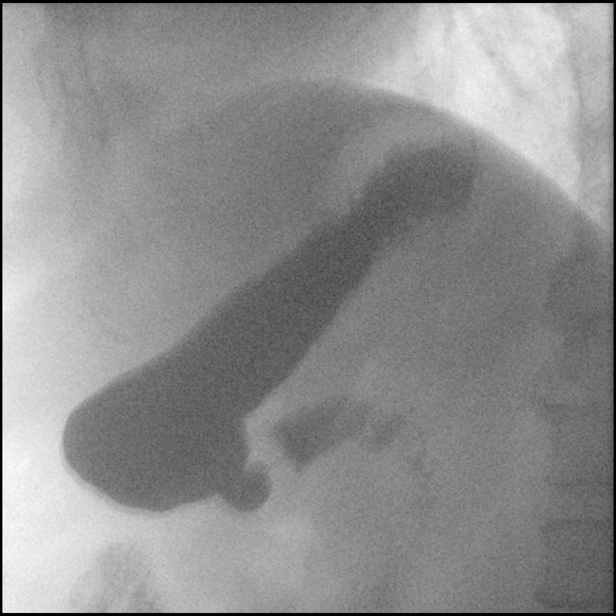

[Series 10: cp_standard · 0.17mm/px · 1 of 1 slices shown (8 of 10)]
[im 1/1]
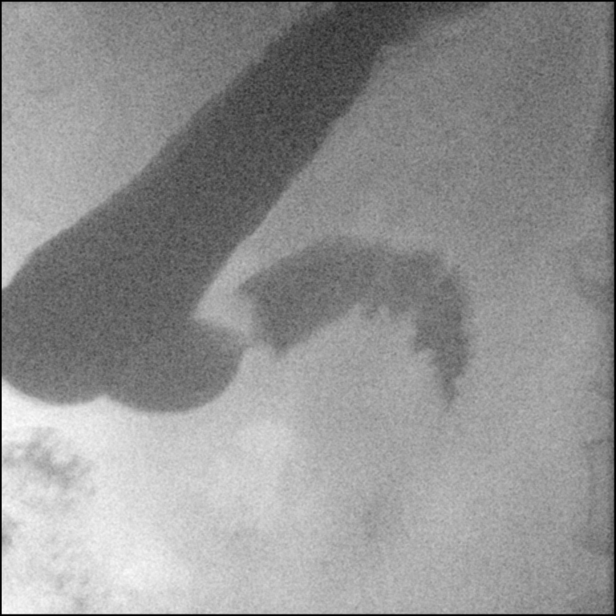

[Series 11: cp_standard · 0.17mm/px · 1 of 1 slices shown (9 of 10)]
[im 1/1]
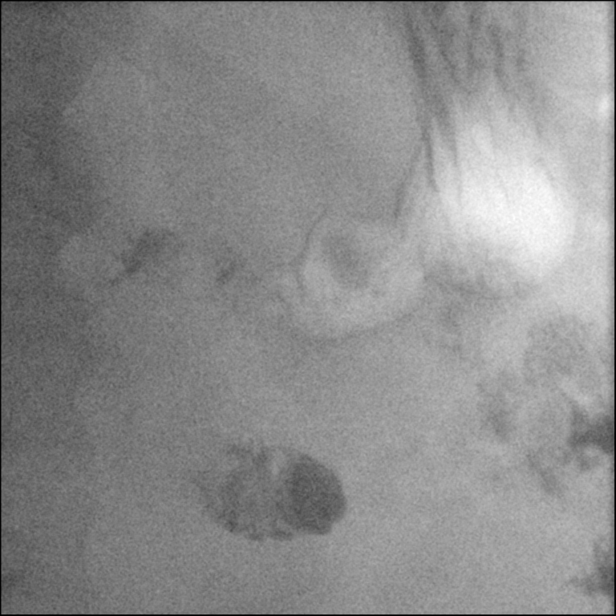

[Series 12: cp_standard · 0.17mm/px · 1 of 1 slices shown (10 of 10)]
[im 1/1]
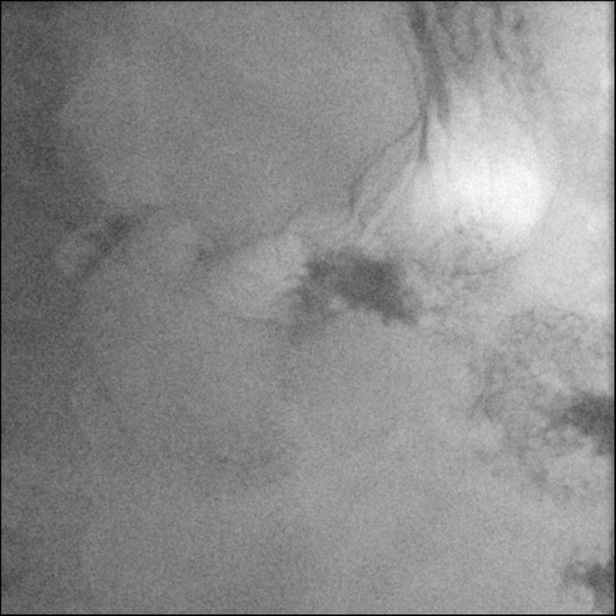

[12 of 12 positions shown; findings below may reference images not displayed]

FINDINGS: Scout radiograph unremarkable.

Normal appearance of the esophagus. No stricture or mass. Motility
normal.

No hiatal hernia or reflux identified.

The stomach, duodenal bulb and loop appear normal.
IMPRESSION: Normal exam.

## 2020-03-02 ENCOUNTER — Other Ambulatory Visit: Payer: Self-pay | Admitting: Family Medicine

## 2020-03-02 DIAGNOSIS — M109 Gout, unspecified: Secondary | ICD-10-CM

## 2020-03-21 ENCOUNTER — Ambulatory Visit: Payer: Federal, State, Local not specified - PPO | Admitting: Skilled Nursing Facility1

## 2020-04-13 ENCOUNTER — Other Ambulatory Visit: Payer: Self-pay

## 2020-04-13 ENCOUNTER — Encounter: Payer: Federal, State, Local not specified - PPO | Attending: Surgery | Admitting: Skilled Nursing Facility1

## 2020-04-13 DIAGNOSIS — E119 Type 2 diabetes mellitus without complications: Secondary | ICD-10-CM | POA: Diagnosis not present

## 2020-04-13 NOTE — Progress Notes (Signed)
Bariatric Nutrition Follow-Up Visit Medical Nutrition Therapy   Pt given star previously: No  NUTRITION ASSESSMENT    Anthropometrics  Start weight at [facility]: 321 lbs (date: 08/25/2018) Today's weight: 243 lbs  Surgery date: 08/31/2019  Clinical  Medical hx:  Medications: see list Labs: A1C 6.8   Lifestyle & Dietary Hx  Pt states he is doing fine with no complaints.   Body Composition Scale 12/21/2019 04/13/2020  Current Body Weight 251 243.6  Total Body Fat % 31.3 30.4  Visceral Fat 21 20  Fat-Free Mass % 68.6 69.5   Total Body Water % 49.6 50.5  Muscle-Mass lbs 47.2 45.9  BMI 36.8 35.7  Body Fat Displacement           Torso  lbs 48.7 45.9         Left Leg  lbs 9.7 9.1         Right Leg  lbs 9.7 9.1         Left Arm  lbs 4.8 4.5         Right Arm   lbs 4.8 4.5    Estimated daily fluid intake: 100+ oz Estimated daily protein intake: 90+ g Supplements: bari advantage and calcium and vitamin D Current average weekly physical activity: gym 3-4 times a week weight lifting, walking with work   24-Hr Dietary Recall First Meal: apple + banana Snack: cheese stick Second Meal 9:30-10: chicken and rice Snack 12: greek yogurt Third Meal 1:30: cottage cheese Snack 2:30: blueberriescheese stick and 3 ounces shrimp Dinner: pulled pork or half steak or chicken noodle + green beans + rice Beverages: plain water, flavored water, whole milk, sweet tea, soda  Post-Op Goals/ Signs/ Symptoms  Using straws: no Drinking while eating: no Chewing/swallowing difficulties: no Changes in vision: no Changes to mood/headaches: no Hair loss/changes to skin/nails: no Difficulty focusing/concentrating: no Sweating: no Dizziness/lightheadedness: no Palpitations: no Carbonated/caffeinated beverages: no N/V/D/C/Gas: no Abdominal pain: no Dumping syndrome: no    NUTRITION DIAGNOSIS  Overweight/obesity ([place]-3.3) related to past poor dietary habits and physical inactivity as evidenced  by completed bariatric surgery and following dietary guidelines for continued weight loss and healthy nutrition status.     NUTRITION INTERVENTION Nutrition counseling (C-1) and education (E-2) to facilitate bariatric surgery goals, including:  . The importance of daily physical activity and to reach a goal of at least 150 minutes of moderate to vigorous physical activity weekly (or as directed by their physician) due to benefits such as increased musculature and improved lab values . The importance of intuitive eating specifically learning hunger-satiety cues and understanding the importance of learning a new body . Limiting sugary beverages as excess sugar has been linked to a multitude of disease states and weight regain  . An awareness of eating too often throughout the day which when done can lead to weight regain  Goals: -Continue to aim for a minimum of 64 fluid ounces 7 days a week with at least 30 ounces being plain water -Eat non-starchy vegetables 2 times a day 7 days a week -Eat your 3 ounces of protein first then start in on your non-starchy vegetables; once you understand how much of your meal leads to satisfaction and not full while still eating 3 ounces of protein and non-starchy vegetables you can eat them in any order  -Continue to aim for 30 minutes of activity at least 5 times a week -Do NOT cook with/add to your food: alfredo sauce, cheese sauce, barbeque sauce, ketchup, fat  back, butter, bacon grease, grease, Crisco, OR SUGAR -Limit fruit to 3 servings per day (consider when eating rice and drinking sugary beverages) -If you are going to have crackers for a snack do not have a starchy vegetable for dinner -Limit sugary beverages to once a week of soda OR sweet tea Limit eating throughout the day to every 3 hours rather than in between  Handouts Provided Include   Bariatric MyPlate  Learning Style & Readiness for Change Teaching method utilized: Visual & Auditory   Demonstrated degree of understanding via: Teach Back  Barriers to learning/adherence to lifestyle change: contemplative stage of change  RD's Notes for Next Visit . Assess adherence to pt chosen goals   MONITORING & EVALUATION Dietary intake, weekly physical activity, body weight  Next Steps Patient is to follow-up

## 2020-06-20 ENCOUNTER — Ambulatory Visit: Payer: Federal, State, Local not specified - PPO | Admitting: Family Medicine

## 2020-06-20 ENCOUNTER — Other Ambulatory Visit: Payer: Self-pay

## 2020-06-20 ENCOUNTER — Encounter: Payer: Self-pay | Admitting: Family Medicine

## 2020-06-20 VITALS — BP 110/68 | HR 74 | Temp 96.6°F | Ht 69.0 in | Wt 249.0 lb

## 2020-06-20 DIAGNOSIS — Z9884 Bariatric surgery status: Secondary | ICD-10-CM

## 2020-06-20 DIAGNOSIS — M109 Gout, unspecified: Secondary | ICD-10-CM | POA: Diagnosis not present

## 2020-06-20 DIAGNOSIS — E119 Type 2 diabetes mellitus without complications: Secondary | ICD-10-CM

## 2020-06-20 DIAGNOSIS — E559 Vitamin D deficiency, unspecified: Secondary | ICD-10-CM

## 2020-06-20 DIAGNOSIS — H7191 Unspecified cholesteatoma, right ear: Secondary | ICD-10-CM

## 2020-06-20 LAB — COMPREHENSIVE METABOLIC PANEL
ALT: 15 U/L (ref 0–53)
AST: 17 U/L (ref 0–37)
Albumin: 4 g/dL (ref 3.5–5.2)
Alkaline Phosphatase: 81 U/L (ref 39–117)
BUN: 12 mg/dL (ref 6–23)
CO2: 31 mEq/L (ref 19–32)
Calcium: 9.3 mg/dL (ref 8.4–10.5)
Chloride: 102 mEq/L (ref 96–112)
Creatinine, Ser: 0.83 mg/dL (ref 0.40–1.50)
GFR: 109.49 mL/min (ref >60.00)
Glucose, Bld: 105 mg/dL — ABNORMAL HIGH (ref 70–99)
Potassium: 4.1 mEq/L (ref 3.5–5.1)
Sodium: 138 mEq/L (ref 135–145)
Total Bilirubin: 0.8 mg/dL (ref 0.2–1.2)
Total Protein: 6.8 g/dL (ref 6.0–8.3)

## 2020-06-20 LAB — URINALYSIS, ROUTINE W REFLEX MICROSCOPIC
Bilirubin Urine: NEGATIVE
Hgb urine dipstick: NEGATIVE
Ketones, ur: NEGATIVE
Leukocytes,Ua: NEGATIVE
Nitrite: NEGATIVE
RBC / HPF: NONE SEEN (ref 0–?)
Specific Gravity, Urine: 1.02 (ref 1.000–1.030)
Total Protein, Urine: NEGATIVE
Urine Glucose: NEGATIVE
Urobilinogen, UA: 0.2 (ref 0.0–1.0)
pH: 6 (ref 5.0–8.0)

## 2020-06-20 LAB — HEMOGLOBIN A1C: Hgb A1c MFr Bld: 5.9 % (ref 4.6–6.5)

## 2020-06-20 LAB — CBC
HCT: 43.7 % (ref 39.0–52.0)
Hemoglobin: 14.8 g/dL (ref 13.0–17.0)
MCHC: 33.8 g/dL (ref 30.0–36.0)
MCV: 87.1 fl (ref 78.0–100.0)
Platelets: 227 10*3/uL (ref 150.0–400.0)
RBC: 5.02 Mil/uL (ref 4.22–5.81)
RDW: 13.4 % (ref 11.5–15.5)
WBC: 4.2 10*3/uL (ref 4.0–10.5)

## 2020-06-20 LAB — LIPID PANEL
Cholesterol: 152 mg/dL (ref 0–200)
HDL: 44.7 mg/dL (ref >39.00)
LDL Cholesterol: 88 mg/dL (ref 0–99)
NonHDL: 107.6
Total CHOL/HDL Ratio: 3
Triglycerides: 98 mg/dL (ref 0.0–149.0)
VLDL: 19.6 mg/dL (ref 0.0–40.0)

## 2020-06-20 LAB — URIC ACID: Uric Acid, Serum: 5.3 mg/dL (ref 4.0–7.8)

## 2020-06-20 LAB — VITAMIN D 25 HYDROXY (VIT D DEFICIENCY, FRACTURES): VITD: 60.51 ng/mL (ref 30.00–100.00)

## 2020-06-20 LAB — MICROALBUMIN / CREATININE URINE RATIO
Creatinine,U: 166.9 mg/dL
Microalb Creat Ratio: 0.6 mg/g (ref 0.0–30.0)
Microalb, Ur: 1.1 mg/dL (ref 0.0–1.9)

## 2020-06-20 NOTE — Progress Notes (Signed)
Established Patient Office Visit  Subjective:  Patient ID: Wayne Castillo, male    DOB: 1980-11-03  Age: 40 y.o. MRN: [id number]  CC:  Chief Complaint  Patient presents with  . Follow-up    6 month follow up, no concerns. Patient fasting for labs.     HPI Wayne Castillo presents for fu of obesity, diabetes, gout, elevated vit d. He is fasting. Continued with 5000U of Vit D. Was asked to decrease to 3000. Continues to lose weight and exercise. Ho TM placement right ear with decreased hearing.   Past Medical History:  Diagnosis Date  . Arthritis   . Diabetes mellitus without complication (HCC)   . Gout   . History of kidney stones 2018  . Hypertension    mild     Past Surgical History:  Procedure Laterality Date  . EAR TUBE REMOVAL    . LAPAROSCOPIC GASTRIC SLEEVE RESECTION N/A 08/31/2019   Procedure: LAPAROSCOPIC GASTRIC SLEEVE RESECTION, Upper Endo, ERAS Pathway;  Surgeon: Berna Bue, MD;  Location: [facility];  Service: General;  Laterality: N/A;    Family History  Problem Relation Age of Onset  . Amblyopia Neg Hx   . Blindness Neg Hx   . Cataracts Neg Hx   . Glaucoma Neg Hx   . Macular degeneration Neg Hx   . Retinal detachment Neg Hx   . Strabismus Neg Hx   . Retinitis pigmentosa Neg Hx     Social History   Socioeconomic History  . Marital status: Single    Spouse name: Not on file  . Number of children: Not on file  . Years of education: Not on file  . Highest education level: Not on file  Occupational History  . Not on file  Tobacco Use  . Smoking status: Never Smoker  . Smokeless tobacco: Never Used  Vaping Use  . Vaping Use: Never used  Substance and Sexual Activity  . Alcohol use: No  . Drug use: No  . Sexual activity: Not on file  Other Topics Concern  . Not on file  Social History Narrative  . Not on file   Social Determinants of Health   Financial Resource Strain: Not on file  Food Insecurity: Not on file  Transportation Needs: Not  on file  Physical Activity: Not on file  Stress: Not on file  Social Connections: Not on file  Intimate Partner Violence: Not on file    Outpatient Medications Prior to Visit  Medication Sig Dispense Refill  . allopurinol (ZYLOPRIM) 300 MG tablet TAKE 1 TABLET(300 MG) BY MOUTH DAILY 90 tablet 1  . CALCIUM PO Take 1 tablet by mouth in the morning, at noon, and at bedtime.    . Cholecalciferol (VITAMIN D) 125 MCG (5000 UT) CAPS Take by mouth.    . Multiple Vitamins-Minerals (BARIATRIC MULTIVITAMINS/IRON PO) Take 2 tablets by mouth daily.     No facility-administered medications prior to visit.    No Known Allergies  ROS Review of Systems  Constitutional: Negative.   HENT: Negative.   Eyes: Negative for photophobia and visual disturbance.  Respiratory: Negative.   Cardiovascular: Negative.   Endocrine: Negative for polyphagia and polyuria.  Musculoskeletal: Negative.   Skin: Negative for pallor and rash.  Allergic/Immunologic: Negative for immunocompromised state.  Neurological: Negative for light-headedness and numbness.  Hematological: Does not bruise/bleed easily.  Psychiatric/Behavioral: Negative.       Objective:    Physical Exam Vitals and nursing note reviewed.  Constitutional:  General: He is not in acute distress.    Appearance: Normal appearance. He is not ill-appearing, toxic-appearing or diaphoretic.  HENT:     Head: Normocephalic and atraumatic.     Right Ear: Ear canal normal. Decreased hearing noted. There is no impacted cerumen. Tympanic membrane is scarred.     Left Ear: Tympanic membrane, ear canal and external ear normal. There is no impacted cerumen.     Ears:      Mouth/Throat:     Mouth: Mucous membranes are dry.  Eyes:     General: No scleral icterus.       Right eye: No discharge.        Left eye: No discharge.  Cardiovascular:     Rate and Rhythm: Normal rate and regular rhythm.  Pulmonary:     Effort: Pulmonary effort is normal.      Breath sounds: Normal breath sounds.  Abdominal:     General: Bowel sounds are normal.  Musculoskeletal:     Cervical back: No rigidity or tenderness.     Right lower leg: No edema.  Lymphadenopathy:     Cervical: No cervical adenopathy.  Skin:    General: Skin is warm and dry.  Neurological:     Mental Status: He is alert and oriented to person, place, and time.  Psychiatric:        Mood and Affect: Mood normal.        Behavior: Behavior normal.     BP 110/68   Pulse 74   Temp (!) 96.6 F (35.9 C) (Temporal)   Ht 5\' 9"  (1.753 m)   Wt 249 lb (112.9 kg)   SpO2 97%   BMI 36.77 kg/m  Wt Readings from Last 3 Encounters:  06/20/20 249 lb (112.9 kg)  04/13/20 243 lb 9.6 oz (110.5 kg)  12/21/19 254 lb 6.4 oz (115.4 kg)     Health Maintenance Due  Topic Date Due  . Hepatitis C Screening  Never done  . PNEUMOCOCCAL POLYSACCHARIDE VACCINE AGE 59-64 HIGH RISK  Never done  . OPHTHALMOLOGY EXAM  02/12/2019  . FOOT EXAM  06/10/2020  . HEMOGLOBIN A1C  06/19/2020    There are no preventive care reminders to display for this patient.  Lab Results  Component Value Date   TSH 0.61 01/13/2018   Lab Results  Component Value Date   WBC 5.7 12/21/2019   HGB 14.6 12/21/2019   HCT 44.0 12/21/2019   MCV 89.0 12/21/2019   PLT 239.0 12/21/2019   Lab Results  Component Value Date   NA 139 12/21/2019   K 4.2 12/21/2019   CO2 27 12/21/2019   GLUCOSE 97 12/21/2019   BUN 14 12/21/2019   CREATININE 0.76 12/21/2019   BILITOT 0.6 09/01/2019   ALKPHOS 65 09/01/2019   AST 25 09/01/2019   ALT 37 09/01/2019   PROT 6.6 09/01/2019   ALBUMIN 3.9 09/01/2019   CALCIUM 9.7 12/21/2019   ANIONGAP 6 09/01/2019   GFR 113.60 12/21/2019   Lab Results  Component Value Date   CHOL 169 06/11/2019   Lab Results  Component Value Date   HDL 41.50 06/11/2019   Lab Results  Component Value Date   LDLCALC 100 (H) 06/11/2019   Lab Results  Component Value Date   TRIG 137.0 06/11/2019    Lab Results  Component Value Date   CHOLHDL 4 06/11/2019   Lab Results  Component Value Date   HGBA1C 6.0 12/21/2019      Assessment &  Plan:   Problem List Items Addressed This Visit      Endocrine   Controlled type 2 diabetes mellitus without complication, without long-term current use of insulin (HCC) - Primary   Relevant Orders   CBC   Comprehensive metabolic panel   Hemoglobin A1c   Urinalysis, Routine w reflex microscopic   Microalbumin / creatinine urine ratio   Lipid panel     Nervous and Auditory   Cholesteatoma of right ear   Relevant Orders   Ambulatory referral to ENT     Other   Gout   Relevant Orders   Uric acid   Vitamin D deficiency   Relevant Orders   VITAMIN D 25 Hydroxy (Vit-D Deficiency, Fractures)   History of bariatric surgery      No orders of the defined types were placed in this encounter.   Follow-up: No follow-ups on file.    Mliss Sax, MD

## 2020-07-11 ENCOUNTER — Encounter (INDEPENDENT_AMBULATORY_CARE_PROVIDER_SITE_OTHER): Payer: Self-pay | Admitting: Otolaryngology

## 2020-07-11 ENCOUNTER — Ambulatory Visit (INDEPENDENT_AMBULATORY_CARE_PROVIDER_SITE_OTHER): Payer: Federal, State, Local not specified - PPO | Admitting: Otolaryngology

## 2020-07-11 ENCOUNTER — Other Ambulatory Visit: Payer: Self-pay

## 2020-07-11 VITALS — Temp 97.2°F

## 2020-07-11 DIAGNOSIS — H6121 Impacted cerumen, right ear: Secondary | ICD-10-CM | POA: Diagnosis not present

## 2020-07-11 DIAGNOSIS — H6981 Other specified disorders of Eustachian tube, right ear: Secondary | ICD-10-CM | POA: Diagnosis not present

## 2020-07-11 NOTE — Progress Notes (Addendum)
HPI: Wayne Castillo is a 40 y.o. male who presents is referred by Dr. Doreene Burke his PCP for evaluation of right ear findings.  I reviewed with the patient he has had longstanding right ear problems.  He stated he had 6 surgeries for tubes or removal of tubes when he was younger and is never heard quite as well on the right side as he does not left but has not noticed any recent change in his hearing.  He has had no drainage from the ear and no right ear pain.  However on recent examination by his PCP there was an abnormality noted..  Past Medical History:  Diagnosis Date  . Arthritis   . Diabetes mellitus without complication (HCC)   . Gout   . History of kidney stones 2018  . Hypertension    mild    Past Surgical History:  Procedure Laterality Date  . EAR TUBE REMOVAL    . LAPAROSCOPIC GASTRIC SLEEVE RESECTION N/A 08/31/2019   Procedure: LAPAROSCOPIC GASTRIC SLEEVE RESECTION, Upper Endo, ERAS Pathway;  Surgeon: Berna Bue, MD;  Location: [facility];  Service: General;  Laterality: N/A;   Social History   Socioeconomic History  . Marital status: Single    Spouse name: Not on file  . Number of children: Not on file  . Years of education: Not on file  . Highest education level: Not on file  Occupational History  . Not on file  Tobacco Use  . Smoking status: Never Smoker  . Smokeless tobacco: Never Used  Vaping Use  . Vaping Use: Never used  Substance and Sexual Activity  . Alcohol use: No  . Drug use: No  . Sexual activity: Not on file  Other Topics Concern  . Not on file  Social History Narrative  . Not on file   Social Determinants of Health   Financial Resource Strain: Not on file  Food Insecurity: Not on file  Transportation Needs: Not on file  Physical Activity: Not on file  Stress: Not on file  Social Connections: Not on file   Family History  Problem Relation Age of Onset  . Amblyopia Neg Hx   . Blindness Neg Hx   . Cataracts Neg Hx   . Glaucoma Neg Hx   .  Macular degeneration Neg Hx   . Retinal detachment Neg Hx   . Strabismus Neg Hx   . Retinitis pigmentosa Neg Hx    No Known Allergies Prior to Admission medications   Medication Sig Start Date End Date Taking? Authorizing Provider  allopurinol (ZYLOPRIM) 300 MG tablet TAKE 1 TABLET(300 MG) BY MOUTH DAILY 03/04/20   Worthy Rancher B, FNP  CALCIUM PO Take 1 tablet by mouth in the morning, at noon, and at bedtime.    [provider]  Cholecalciferol (VITAMIN D) 125 MCG (5000 UT) CAPS Take by mouth.    [provider]  Multiple Vitamins-Minerals (BARIATRIC MULTIVITAMINS/IRON PO) Take 2 tablets by mouth daily.    [provider]     Positive ROS: Otherwise negative  All other systems have been reviewed and were otherwise negative with the exception of those mentioned in the HPI and as above.  Physical Exam: Constitutional: Alert, well-appearing, no acute distress Ears: External ears without lesions or tenderness.  Left ear canal and left TM are normal.  On examination of the right TM he has a slight retraction pocket posteriorly of the right TM that is adherent to the right incus as he is  able to insufflate some air behind the TM but the TM stays adherent to the incus.  In addition he has a small yellow round lesion within the posterior inferior TM consistent with probable fat graft myringoplasty when he had one of his tubes removed.  Just anterior to this he had a small scab or crusting that was removed in the office.  The TM was intact.  There is no evidence of cholesteatoma on microscopic exam.  The TM had reasonable mobility.  On hearing screening with a tuning forks he heard about the same in both ears with AC > BC bilaterally and no significant hearing loss noted on the right side. Nasal: External nose without lesions. Septum with mild deformity and mild rhinitis.. Clear nasal passages otherwise with no signs of infection or mucopurulent discharge from the middle  meatus. Oral: Lips and gums without lesions. Tongue and palate mucosa without lesions. Posterior oropharynx clear. Neck: No palpable adenopathy or masses Respiratory: Breathing comfortably  Skin: No facial/neck lesions or rash noted.  Cerumen impaction removal  Date/Time: 07/12/2020 9:31 AM Performed by: Drema Halon, MD Authorized by: Drema Halon, MD   Consent:    Consent obtained:  Verbal   Consent given by:  Patient   Risks discussed:  Pain and bleeding Procedure details:    Location:  R ear   Procedure type: curette, suction and forceps   Post-procedure details:    Inspection:  TM intact and canal normal   Hearing quality:  Improved   Patient tolerance of procedure:  Tolerated well, no immediate complications Comments:     On microscopic exam of the right TM patient has some crusting anterior inferiorly that was removed.  He also had a small rounded yellow lesion within the TM inferiorly that was consistent with probable fat graft from previous surgeries.  No evidence of cholesteatoma however the TM is retracted and partially adherent to the incus posterior superiorly.    Assessment: Right eustachian tube dysfunction with slightly retracted right TM. No evidence of cholesteatoma but patient does have what appears to be a round yellow probable fat globules within the inferior posterior right TM.  Plan: Discussed with the patient that there is no evidence of cholesteatoma that these changes are probably chronic and would not recommend any further treatment unless he notices any decline in his hearing, drainage from the ear or pain in the ear.   Narda Bonds, MD   CC:

## 2020-07-12 DIAGNOSIS — H6981 Other specified disorders of Eustachian tube, right ear: Secondary | ICD-10-CM | POA: Diagnosis not present

## 2020-07-12 DIAGNOSIS — H6121 Impacted cerumen, right ear: Secondary | ICD-10-CM | POA: Diagnosis not present

## 2020-09-05 LAB — HM DIABETES EYE EXAM

## 2020-09-13 ENCOUNTER — Other Ambulatory Visit: Payer: Self-pay | Admitting: Family

## 2020-09-13 DIAGNOSIS — M109 Gout, unspecified: Secondary | ICD-10-CM

## 2020-09-26 ENCOUNTER — Other Ambulatory Visit: Payer: Self-pay | Admitting: Family

## 2020-09-26 DIAGNOSIS — M109 Gout, unspecified: Secondary | ICD-10-CM

## 2020-10-12 ENCOUNTER — Other Ambulatory Visit: Payer: Self-pay

## 2020-10-12 ENCOUNTER — Encounter: Payer: Federal, State, Local not specified - PPO | Attending: Surgery | Admitting: Skilled Nursing Facility1

## 2020-10-12 DIAGNOSIS — E669 Obesity, unspecified: Secondary | ICD-10-CM | POA: Diagnosis not present

## 2020-10-12 NOTE — Progress Notes (Signed)
Bariatric Nutrition Follow-Up Visit Medical Nutrition Therapy   Pt given star previously: No  NUTRITION ASSESSMENT    Anthropometrics  Start weight at [facility]: 321 lbs (date: 08/25/2018) Today's weight: 250.3 lbs  Surgery date: 08/31/2019  Clinical  Medical hx:  Medications: see list Labs: A1C 5.9 down from 6.8, vitmain D 60.51, triglycerides 98   Lifestyle & Dietary Hx  Pt states he is doing fine with no complaints.   Pt state she joined a gym recently which he has enjoyed especially the punching bag and weight machines.  Pt states his energy has been okay sometimes feeling tired after a long day at work. Pt sates he is happy with his weight and wishes to stay here.   Body Composition Scale 12/21/2019 04/13/2020 10/12/2020  Current Body Weight 251 243.6 250.3  Total Body Fat % 31.3 30.4 31.3  Visceral Fat 21 20 21   Fat-Free Mass % 68.6 69.5 68.6   Total Body Water % 49.6 50.5 49.6  Muscle-Mass lbs 47.2 45.9 47.1  BMI 36.8 35.7 36.7  Body Fat Displacement            Torso  lbs 48.7 45.9 48.6         Left Leg  lbs 9.7 9.1 9.7         Right Leg  lbs 9.7 9.1 9.7         Left Arm  lbs 4.8 4.5 4.8         Right Arm   lbs 4.8 4.5 4.8    Estimated daily fluid intake: 100+ oz Estimated daily protein intake: 90+ g Supplements: bari advantage and calcium and vitamin D Current average weekly physical activity: gym 3-4 times a week weight lifting, walking with work   24-Hr Dietary Recall First Meal: fruit + tuna  Snack: banana Snack: greek yogurt Second Meal 3pm: 3 ounces shrimp + cheese stick + blueberries Snack: Third Meal 6: chili or steak + veggie Snack: yogurt or fruit Beverages: plain water, flavored water, whole milk, sweet tea, soda  Post-Op Goals/ Signs/ Symptoms  Using straws: no Drinking while eating: no Chewing/swallowing difficulties: no Changes in vision: no Changes to mood/headaches: no Hair loss/changes to skin/nails: no Difficulty  focusing/concentrating: no Sweating: no Dizziness/lightheadedness: no Palpitations: no Carbonated/caffeinated beverages: no N/V/D/C/Gas: no Abdominal pain: no Dumping syndrome: no    NUTRITION DIAGNOSIS  Overweight/obesity ([place]-3.3) related to past poor dietary habits and physical inactivity as evidenced by completed bariatric surgery and following dietary guidelines for continued weight loss and healthy nutrition status.     NUTRITION INTERVENTION Nutrition counseling (C-1) and education (E-2) to facilitate bariatric surgery goals, including:   The importance of daily physical activity and to reach a goal of at least 150 minutes of moderate to vigorous physical activity weekly (or as directed by their physician) due to benefits such as increased musculature and improved lab values The importance of intuitive eating specifically learning hunger-satiety cues and understanding the importance of learning a new body Limiting sugary beverages as excess sugar has been linked to a multitude of disease states and weight regain  An awareness of eating too often throughout the day which when done can lead to weight regain Encouraged patient to honor their body's internal hunger and fullness cues.  Throughout the day, check in mentally and rate hunger. Stop eating when satisfied not full regardless of how much food is left on the plate.  Get more if still hungry 20-30 minutes later.  The key is to  honor satisfaction so throughout the meal, rate fullness factor and stop when comfortably satisfied not physically full. The key is to honor hunger and fullness without any feelings of guilt or shame.  Pay attention to what the internal cues are, rather than any external factors. This will enhance the confidence you have in listening to your own body and following those internal cues enabling you to increase how often you eat when you are hungry not out of appetite and stop when you are satisfied not full.   Encouraged pt to continue to eat balanced meals inclusive of non starchy vegetables 2 times a day 7 days a week Encouraged pt to choose lean protein sources: limiting beef, pork, sausage, hotdogs, and lunch meat Encourage pt to choose healthy fats such as plant based limiting animal fats Encouraged pt to continue to drink a minium 64 fluid ounces with half being plain water to satisfy proper hydration    Goals: continued -Continue to aim for a minimum of 64 fluid ounces 7 days a week with at least 30 ounces being plain water -Eat non-starchy vegetables 2 times a day 7 days a week -Eat your 3 ounces of protein first then start in on your non-starchy vegetables; once you understand how much of your meal leads to satisfaction and not full while still eating 3 ounces of protein and non-starchy vegetables you can eat them in any order  -Continue to aim for 30 minutes of activity at least 5 times a week -Do NOT cook with/add to your food: alfredo sauce, cheese sauce, barbeque sauce, ketchup, fat back, butter, bacon grease, grease, Crisco, OR SUGAR -Limit fruit to 3 servings per day (consider when eating rice and drinking sugary beverages) -If you are going to have crackers for a snack do not have a starchy vegetable for dinner -Limit sugary beverages to once a week of soda OR sweet tea -Limit eating throughout the day to every 3 hours rather than in between  Handouts Provided Include  Bariatric MyPlate  Learning Style & Readiness for Change Teaching method utilized: Visual & Auditory  Demonstrated degree of understanding via: Teach Back    RD's Notes for Next Visit Assess adherence to pt chosen goals   MONITORING & EVALUATION Dietary intake, weekly physical activity, body weight  Next Steps Patient is to follow-up as needed

## 2020-12-20 ENCOUNTER — Other Ambulatory Visit: Payer: Self-pay

## 2020-12-21 ENCOUNTER — Ambulatory Visit: Payer: Federal, State, Local not specified - PPO | Admitting: Family Medicine

## 2021-01-15 ENCOUNTER — Other Ambulatory Visit: Payer: Self-pay

## 2021-01-15 ENCOUNTER — Encounter: Payer: Self-pay | Admitting: Family Medicine

## 2021-01-15 ENCOUNTER — Ambulatory Visit: Payer: Federal, State, Local not specified - PPO | Admitting: Family Medicine

## 2021-01-15 VITALS — BP 115/76 | HR 66 | Temp 96.9°F | Ht 69.0 in | Wt 251.2 lb

## 2021-01-15 DIAGNOSIS — E559 Vitamin D deficiency, unspecified: Secondary | ICD-10-CM

## 2021-01-15 DIAGNOSIS — H6121 Impacted cerumen, right ear: Secondary | ICD-10-CM

## 2021-01-15 DIAGNOSIS — R7303 Prediabetes: Secondary | ICD-10-CM | POA: Diagnosis not present

## 2021-01-15 DIAGNOSIS — M109 Gout, unspecified: Secondary | ICD-10-CM | POA: Diagnosis not present

## 2021-01-15 DIAGNOSIS — E119 Type 2 diabetes mellitus without complications: Secondary | ICD-10-CM | POA: Diagnosis not present

## 2021-01-15 DIAGNOSIS — Z9884 Bariatric surgery status: Secondary | ICD-10-CM | POA: Diagnosis not present

## 2021-01-15 LAB — COMPREHENSIVE METABOLIC PANEL
ALT: 14 U/L (ref 0–53)
AST: 15 U/L (ref 0–37)
Albumin: 4.1 g/dL (ref 3.5–5.2)
Alkaline Phosphatase: 89 U/L (ref 39–117)
BUN: 17 mg/dL (ref 6–23)
CO2: 29 mEq/L (ref 19–32)
Calcium: 9 mg/dL (ref 8.4–10.5)
Chloride: 104 mEq/L (ref 96–112)
Creatinine, Ser: 0.89 mg/dL (ref 0.40–1.50)
GFR: 106.78 mL/min (ref >60.00)
Glucose, Bld: 111 mg/dL — ABNORMAL HIGH (ref 70–99)
Potassium: 4.4 mEq/L (ref 3.5–5.1)
Sodium: 139 mEq/L (ref 135–145)
Total Bilirubin: 0.7 mg/dL (ref 0.2–1.2)
Total Protein: 6.6 g/dL (ref 6.0–8.3)

## 2021-01-15 LAB — VITAMIN D 25 HYDROXY (VIT D DEFICIENCY, FRACTURES): VITD: 54.49 ng/mL (ref 30.00–100.00)

## 2021-01-15 LAB — URIC ACID: Uric Acid, Serum: 5.5 mg/dL (ref 4.0–7.8)

## 2021-01-15 LAB — HEMOGLOBIN A1C: Hgb A1c MFr Bld: 6.1 % (ref 4.6–6.5)

## 2021-01-15 LAB — VITAMIN B12: Vitamin B-12: 572 pg/mL (ref 211–911)

## 2021-01-15 NOTE — Progress Notes (Signed)
Established Patient Office Visit  Subjective:  Patient ID: Wayne Castillo, male    DOB: November 17, 1980  Age: 40 y.o. MRN: [id number]  CC:  Chief Complaint  Patient presents with   Follow-up    Routine follow up, patient fasting.     HPI Wayne Castillo presents for follow-up of prediabetes, gout, obesity status post gastric sleeve placement.  Continues with his weight loss journey.  Scales at home have been down to 245.  Continues to workout in the gym plus work an active job at the [employer].  Has had no further gouty attacks.  Using Q-tips to clean ears.    Past Medical History:  Diagnosis Date   Arthritis    Diabetes mellitus without complication (HCC)    Gout    History of kidney stones 2018   Hypertension    mild     Past Surgical History:  Procedure Laterality Date   EAR TUBE REMOVAL     LAPAROSCOPIC GASTRIC SLEEVE RESECTION N/A 08/31/2019   Procedure: LAPAROSCOPIC GASTRIC SLEEVE RESECTION, Upper Endo, ERAS Pathway;  Surgeon: Berna Bue, MD;  Location: [facility];  Service: General;  Laterality: N/A;    Family History  Problem Relation Age of Onset   Amblyopia Neg Hx    Blindness Neg Hx    Cataracts Neg Hx    Glaucoma Neg Hx    Macular degeneration Neg Hx    Retinal detachment Neg Hx    Strabismus Neg Hx    Retinitis pigmentosa Neg Hx     Social History   Socioeconomic History   Marital status: Single    Spouse name: Not on file   Number of children: Not on file   Years of education: Not on file   Highest education level: Not on file  Occupational History   Not on file  Tobacco Use   Smoking status: Never   Smokeless tobacco: Never  Vaping Use   Vaping Use: Never used  Substance and Sexual Activity   Alcohol use: No   Drug use: No   Sexual activity: Not on file  Other Topics Concern   Not on file  Social History Narrative   Not on file   Social Determinants of Health   Financial Resource Strain: Not on file  Food Insecurity: Not on file   Transportation Needs: Not on file  Physical Activity: Not on file  Stress: Not on file  Social Connections: Not on file  Intimate Partner Violence: Not on file    Outpatient Medications Prior to Visit  Medication Sig Dispense Refill   allopurinol (ZYLOPRIM) 300 MG tablet TAKE 1 TABLET(300 MG) BY MOUTH DAILY 90 tablet 1   CALCIUM PO Take 1 tablet by mouth in the morning, at noon, and at bedtime.     Cholecalciferol (VITAMIN D) 125 MCG (5000 UT) CAPS Take by mouth.     Multiple Vitamins-Minerals (BARIATRIC MULTIVITAMINS/IRON PO) Take 2 tablets by mouth daily.     No facility-administered medications prior to visit.    No Known Allergies  ROS Review of Systems  Constitutional: Negative.   HENT: Negative.    Respiratory: Negative.    Cardiovascular: Negative.   Gastrointestinal: Negative.   Genitourinary: Negative.   Musculoskeletal:  Negative for gait problem and joint swelling.  Skin:  Negative for color change and pallor.  Neurological:  Negative for speech difficulty and weakness.  Psychiatric/Behavioral: Negative.       Objective:    Physical Exam Vitals and  nursing note reviewed.  Constitutional:      General: He is not in acute distress.    Appearance: Normal appearance. He is not ill-appearing, toxic-appearing or diaphoretic.  HENT:     Head: Normocephalic and atraumatic.     Right Ear: Tympanic membrane, ear canal and external ear normal. There is impacted cerumen.     Left Ear: Tympanic membrane, ear canal and external ear normal.     Mouth/Throat:     Mouth: Mucous membranes are moist.     Pharynx: Oropharynx is clear. No oropharyngeal exudate or posterior oropharyngeal erythema.  Eyes:     General: No scleral icterus.       Right eye: No discharge.        Left eye: No discharge.     Conjunctiva/sclera: Conjunctivae normal.     Pupils: Pupils are equal, round, and reactive to light.  Cardiovascular:     Rate and Rhythm: Normal rate and regular rhythm.   Pulmonary:     Effort: Pulmonary effort is normal.     Breath sounds: Normal breath sounds.  Abdominal:     General: Bowel sounds are normal.  Musculoskeletal:     Cervical back: No rigidity or tenderness.  Lymphadenopathy:     Cervical: No cervical adenopathy.  Skin:    General: Skin is warm and dry.  Neurological:     Mental Status: He is alert and oriented to person, place, and time.  Psychiatric:        Mood and Affect: Mood normal.        Behavior: Behavior normal.    BP 115/76 (BP Location: Left Arm, Patient Position: Sitting, Cuff Size: Normal)   Pulse 66   Temp (!) 96.9 F (36.1 C) (Temporal)   Ht 5\' 9"  (1.753 m)   Wt 251 lb 3.2 oz (113.9 kg)   SpO2 96%   BMI 37.10 kg/m  Wt Readings from Last 3 Encounters:  01/15/21 251 lb 3.2 oz (113.9 kg)  10/12/20 250 lb 4.8 oz (113.5 kg)  06/20/20 249 lb (112.9 kg)     Health Maintenance Due  Topic Date Due   Hepatitis C Screening  Never done   OPHTHALMOLOGY EXAM  02/12/2019   FOOT EXAM  06/10/2020   HEMOGLOBIN A1C  12/21/2020    There are no preventive care reminders to display for this patient.  Lab Results  Component Value Date   TSH 0.61 01/13/2018   Lab Results  Component Value Date   WBC 4.2 06/20/2020   HGB 14.8 06/20/2020   HCT 43.7 06/20/2020   MCV 87.1 06/20/2020   PLT 227.0 06/20/2020   Lab Results  Component Value Date   NA 138 06/20/2020   K 4.1 06/20/2020   CO2 31 06/20/2020   GLUCOSE 105 (H) 06/20/2020   BUN 12 06/20/2020   CREATININE 0.83 06/20/2020   BILITOT 0.8 06/20/2020   ALKPHOS 81 06/20/2020   AST 17 06/20/2020   ALT 15 06/20/2020   PROT 6.8 06/20/2020   ALBUMIN 4.0 06/20/2020   CALCIUM 9.3 06/20/2020   ANIONGAP 6 09/01/2019   GFR 109.49 06/20/2020   Lab Results  Component Value Date   CHOL 152 06/20/2020   Lab Results  Component Value Date   HDL 44.70 06/20/2020   Lab Results  Component Value Date   LDLCALC 88 06/20/2020   Lab Results  Component Value Date    TRIG 98.0 06/20/2020   Lab Results  Component Value Date   CHOLHDL 3  06/20/2020   Lab Results  Component Value Date   HGBA1C 5.9 06/20/2020      Assessment & Plan:   Problem List Items Addressed This Visit       Endocrine   RESOLVED: Controlled type 2 diabetes mellitus without complication, without long-term current use of insulin (HCC)     Other   Gout   Relevant Orders   Comprehensive metabolic panel   Uric acid   Vitamin D deficiency   Relevant Orders   VITAMIN D 25 Hydroxy (Vit-D Deficiency, Fractures)   History of bariatric surgery - Primary   Relevant Orders   Vitamin B12   VITAMIN D 25 Hydroxy (Vit-D Deficiency, Fractures)   Pre-diabetes   Relevant Orders   Comprehensive metabolic panel   Hemoglobin A1c    No orders of the defined types were placed in this encounter.   Follow-up: Return in about 6 months (around 07/16/2021).  Will continue his weight loss journey.  Hopes to get down to 225.  Advised patient to avoid Q-tips and use eardrops designated for cerumen removal.  Mliss Sax, MD

## 2021-01-16 ENCOUNTER — Ambulatory Visit: Payer: Federal, State, Local not specified - PPO | Admitting: Family Medicine

## 2021-01-18 ENCOUNTER — Encounter: Payer: Self-pay | Admitting: Family Medicine

## 2021-04-04 ENCOUNTER — Other Ambulatory Visit: Payer: Self-pay | Admitting: Family Medicine

## 2021-04-04 DIAGNOSIS — M109 Gout, unspecified: Secondary | ICD-10-CM

## 2021-07-16 ENCOUNTER — Ambulatory Visit: Payer: Federal, State, Local not specified - PPO | Admitting: Family Medicine

## 2021-07-30 ENCOUNTER — Ambulatory Visit: Payer: Federal, State, Local not specified - PPO | Admitting: Family Medicine

## 2021-07-30 ENCOUNTER — Encounter: Payer: Self-pay | Admitting: Family Medicine

## 2021-07-30 VITALS — BP 118/70 | HR 60 | Temp 96.9°F | Ht 69.0 in | Wt 263.6 lb

## 2021-07-30 DIAGNOSIS — Z9884 Bariatric surgery status: Secondary | ICD-10-CM

## 2021-07-30 DIAGNOSIS — R7303 Prediabetes: Secondary | ICD-10-CM

## 2021-07-30 LAB — VITAMIN B12: Vitamin B-12: 346 pg/mL (ref 211–911)

## 2021-07-30 LAB — VITAMIN D 25 HYDROXY (VIT D DEFICIENCY, FRACTURES): VITD: 40.61 ng/mL (ref 30.00–100.00)

## 2021-07-30 LAB — CBC
HCT: 43.6 % (ref 39.0–52.0)
Hemoglobin: 14.6 g/dL (ref 13.0–17.0)
MCHC: 33.4 g/dL (ref 30.0–36.0)
MCV: 88.3 fl (ref 78.0–100.0)
Platelets: 227 10*3/uL (ref 150.0–400.0)
RBC: 4.94 Mil/uL (ref 4.22–5.81)
RDW: 13.5 % (ref 11.5–15.5)
WBC: 6 10*3/uL (ref 4.0–10.5)

## 2021-07-30 LAB — HEMOGLOBIN A1C: Hgb A1c MFr Bld: 6.2 % (ref 4.6–6.5)

## 2021-07-30 NOTE — Progress Notes (Signed)
? ?Established Patient Office Visit ? ?Subjective   ?Patient ID: Wayne Castillo, male    DOB: 1980-08-23  Age: 41 y.o. MRN: [id number] ? ?Chief Complaint  ?Patient presents with  ? Follow-up  ?  6 month follow up, no concerns. Patient not fasting.   ? ? ?HPI follow-up of prediabetes and history of bariatric surgery with a gastric sleeve.  At this point he is taking a multivitamin and supplementing with 5000 international units of vitamin D daily.  Continues to be active going to the gym at least 3 days weekly.  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  Doing well ? ? ? ?Review of Systems  ?Constitutional: Negative.   ?HENT: Negative.    ?Eyes:  Negative for blurred vision, discharge and redness.  ?Respiratory: Negative.    ?Cardiovascular: Negative.   ?Gastrointestinal:  Negative for abdominal pain.  ?Genitourinary: Negative.   ?Musculoskeletal: Negative.  Negative for joint pain and myalgias.  ?Skin:  Negative for rash.  ?Neurological:  Negative for tingling, loss of consciousness and weakness.  ?Endo/Heme/Allergies:  Negative for polydipsia.  ? ?  ?Objective:  ?  ? ?BP 118/70 (BP Location: Left Arm, Patient Position: Sitting, Cuff Size: Large)   Pulse 60   Temp (!) 96.9 ?F (36.1 ?C) (Temporal)   Ht 5\' 9"  (1.753 m)   Wt 263 lb 9.6 oz (119.6 kg)   SpO2 96%   BMI 38.93 kg/m?  ?Wt Readings from Last 3 Encounters:  ?07/30/21 263 lb 9.6 oz (119.6 kg)  ?01/15/21 251 lb 3.2 oz (113.9 kg)  ?10/12/20 250 lb 4.8 oz (113.5 kg)  ? ?  ? ?Physical Exam ?Constitutional:   ?   General: He is not in acute distress. ?   Appearance: Normal appearance. He is not ill-appearing, toxic-appearing or diaphoretic.  ?HENT:  ?   Head: Normocephalic and atraumatic.  ?   Right Ear: External ear normal.  ?   Left Ear: External ear normal.  ?   Mouth/Throat:  ?   Mouth: Mucous membranes are moist.  ?   Pharynx: Oropharynx is clear. No oropharyngeal exudate or posterior oropharyngeal erythema.  ?Eyes:  ?   General: No scleral icterus.     ?   Right eye: No discharge.     ?   Left eye: No discharge.  ?   Extraocular Movements: Extraocular movements intact.  ?   Conjunctiva/sclera: Conjunctivae normal.  ?   Pupils: Pupils are equal, round, and reactive to light.  ?Cardiovascular:  ?   Rate and Rhythm: Normal rate and regular rhythm.  ?Pulmonary:  ?   Effort: Pulmonary effort is normal. No respiratory distress.  ?   Breath sounds: Normal breath sounds.  ?Abdominal:  ?   General: Bowel sounds are normal.  ?Musculoskeletal:  ?   Cervical back: No rigidity or tenderness.  ?Skin: ?   General: Skin is warm and dry.  ?Neurological:  ?   Mental Status: He is alert and oriented to person, place, and time.  ?Psychiatric:     ?   Mood and Affect: Mood normal.     ?   Behavior: Behavior normal.  ? ? ? ?No results found for any visits on 07/30/21. ? ? ? ?The 10-year ASCVD risk score (Arnett DK, et al., 2019) is: 1.4% ? ?  ?Assessment & Plan:  ? ?Problem List Items Addressed This Visit   ? ?  ? Other  ? History of bariatric surgery - Primary  ?  Relevant Orders  ? CBC  ? Iron, TIBC and Ferritin Panel  ? Vitamin B12  ? VITAMIN D 25 Hydroxy (Vit-D Deficiency, Fractures)  ? Pre-diabetes  ? Relevant Orders  ? Hemoglobin A1c  ? ? ?Return in about 1 year (around 07/31/2022), or if symptoms worsen or fail to improve.  ?We will continue his weight loss journey.  Continue exercising and eating a healthy diet.  We will follow-up in 1 year unless today's labs indicate that he should be follow-up sooner. ? ?Mliss Sax, MD ? ?

## 2021-07-31 LAB — IRON,TIBC AND FERRITIN PANEL
%SAT: 26 % (calc) (ref 20–48)
Ferritin: 46 ng/mL (ref 38–380)
Iron: 92 ug/dL (ref 50–180)
TIBC: 349 mcg/dL (calc) (ref 250–425)

## 2021-10-01 ENCOUNTER — Other Ambulatory Visit: Payer: Self-pay | Admitting: Family Medicine

## 2021-10-01 DIAGNOSIS — M109 Gout, unspecified: Secondary | ICD-10-CM

## 2022-03-22 ENCOUNTER — Encounter (HOSPITAL_COMMUNITY): Payer: Self-pay | Admitting: *Deleted

## 2022-03-30 ENCOUNTER — Other Ambulatory Visit: Payer: Self-pay | Admitting: Family Medicine

## 2022-03-30 DIAGNOSIS — M109 Gout, unspecified: Secondary | ICD-10-CM

## 2022-08-01 ENCOUNTER — Ambulatory Visit (INDEPENDENT_AMBULATORY_CARE_PROVIDER_SITE_OTHER): Payer: Federal, State, Local not specified - PPO | Admitting: Family Medicine

## 2022-08-01 ENCOUNTER — Encounter: Payer: Self-pay | Admitting: Family Medicine

## 2022-08-01 VITALS — BP 120/72 | HR 76 | Temp 98.3°F | Ht 69.0 in | Wt 263.8 lb

## 2022-08-01 DIAGNOSIS — R7303 Prediabetes: Secondary | ICD-10-CM | POA: Diagnosis not present

## 2022-08-01 DIAGNOSIS — M109 Gout, unspecified: Secondary | ICD-10-CM

## 2022-08-01 DIAGNOSIS — Z9884 Bariatric surgery status: Secondary | ICD-10-CM

## 2022-08-01 DIAGNOSIS — Z0000 Encounter for general adult medical examination without abnormal findings: Secondary | ICD-10-CM | POA: Diagnosis not present

## 2022-08-01 LAB — COMPREHENSIVE METABOLIC PANEL
ALT: 26 U/L (ref 0–53)
AST: 22 U/L (ref 0–37)
Albumin: 4.3 g/dL (ref 3.5–5.2)
Alkaline Phosphatase: 96 U/L (ref 39–117)
BUN: 13 mg/dL (ref 6–23)
CO2: 28 mEq/L (ref 19–32)
Calcium: 9.3 mg/dL (ref 8.4–10.5)
Chloride: 102 mEq/L (ref 96–112)
Creatinine, Ser: 0.83 mg/dL (ref 0.40–1.50)
GFR: 107.88 mL/min (ref >60.00)
Glucose, Bld: 127 mg/dL — ABNORMAL HIGH (ref 70–99)
Potassium: 4.4 mEq/L (ref 3.5–5.1)
Sodium: 138 mEq/L (ref 135–145)
Total Bilirubin: 0.6 mg/dL (ref 0.2–1.2)
Total Protein: 7.3 g/dL (ref 6.0–8.3)

## 2022-08-01 LAB — HEMOGLOBIN A1C: Hgb A1c MFr Bld: 6.5 % (ref 4.6–6.5)

## 2022-08-01 LAB — URINALYSIS, ROUTINE W REFLEX MICROSCOPIC
Bilirubin Urine: NEGATIVE
Hgb urine dipstick: NEGATIVE
Ketones, ur: NEGATIVE
Leukocytes,Ua: NEGATIVE
Nitrite: NEGATIVE
RBC / HPF: NONE SEEN (ref 0–?)
Specific Gravity, Urine: 1.025 (ref 1.000–1.030)
Total Protein, Urine: NEGATIVE
Urine Glucose: NEGATIVE
Urobilinogen, UA: 0.2 (ref 0.0–1.0)
pH: 6 (ref 5.0–8.0)

## 2022-08-01 LAB — CBC
HCT: 47 % (ref 39.0–52.0)
Hemoglobin: 15.5 g/dL (ref 13.0–17.0)
MCHC: 33 g/dL (ref 30.0–36.0)
MCV: 87.7 fl (ref 78.0–100.0)
Platelets: 234 10*3/uL (ref 150.0–400.0)
RBC: 5.37 Mil/uL (ref 4.22–5.81)
RDW: 13.3 % (ref 11.5–15.5)
WBC: 5.5 10*3/uL (ref 4.0–10.5)

## 2022-08-01 LAB — VITAMIN D 25 HYDROXY (VIT D DEFICIENCY, FRACTURES): VITD: 44.11 ng/mL (ref 30.00–100.00)

## 2022-08-01 LAB — LIPID PANEL
Cholesterol: 159 mg/dL (ref 0–200)
HDL: 48.8 mg/dL (ref >39.00)
LDL Cholesterol: 88 mg/dL (ref 0–99)
NonHDL: 110.56
Total CHOL/HDL Ratio: 3
Triglycerides: 115 mg/dL (ref 0.0–149.0)
VLDL: 23 mg/dL (ref 0.0–40.0)

## 2022-08-01 LAB — EXTRA SPECIMEN

## 2022-08-01 LAB — B12 AND FOLATE PANEL
Folate: >23.5 ng/mL (ref >5.9)
Vitamin B-12: 378 pg/mL (ref 211–911)

## 2022-08-01 LAB — MICROALBUMIN / CREATININE URINE RATIO
Creatinine,U: 199.4 mg/dL
Microalb Creat Ratio: 2.7 mg/g (ref 0.0–30.0)
Microalb, Ur: 5.3 mg/dL — ABNORMAL HIGH (ref 0.0–1.9)

## 2022-08-01 LAB — URIC ACID: Uric Acid, Serum: 5.1 mg/dL (ref 4.0–7.8)

## 2022-08-01 MED ORDER — VITAMIN D 125 MCG (5000 UT) PO CAPS: ORAL_CAPSULE | ORAL | 3 refills | Status: AC

## 2022-08-01 MED ORDER — ALLOPURINOL 300 MG PO TABS: ORAL_TABLET | ORAL | 3 refills | Status: DC

## 2022-08-01 NOTE — Progress Notes (Addendum)
Established Patient Office Visit   Subjective:  Patient ID: BACH ROCCHI, male    DOB: 12-23-1980  Age: 42 y.o. MRN: [id number]  Chief Complaint  Patient presents with   Annual Exam    CPE, no concerns. Patient fasting.     HPI Encounter Diagnoses  Name Primary?   Pre-diabetes Yes   Gout, unspecified cause, unspecified chronicity, unspecified site    Healthcare maintenance    History of bariatric surgery    For physical today follow-up gout and history of bariatric surgery.  No longer seeing bariatric surgery.  He is taking a multivitamin.  No regular dental care.  He did not Lobby[employer].  Continues to workout 2 to 3 hours weekly.  Weight has increased since last visit.  He now has restarted his post bariatric surgery diet.  No gouty attacks since starting allopurinol.   Review of Systems  Constitutional: Negative.   HENT: Negative.    Eyes:  Negative for blurred vision, discharge and redness.  Respiratory: Negative.    Cardiovascular: Negative.   Gastrointestinal:  Negative for abdominal pain.  Genitourinary: Negative.   Musculoskeletal: Negative.  Negative for myalgias.  Skin:  Negative for rash.  Neurological:  Negative for tingling, loss of consciousness and weakness.  Endo/Heme/Allergies:  Negative for polydipsia.     Current Outpatient Medications:    metFORMIN (GLUCOPHAGE-XR) 500 MG 24 hr tablet, Take 1 tablet (500 mg total) by mouth daily with breakfast., Disp: 90 tablet, Rfl: 0   allopurinol (ZYLOPRIM) 300 MG tablet, TAKE 1 TABLET(300 MG) BY MOUTH DAILY, Disp: 90 tablet, Rfl: 3   Cholecalciferol (VITAMIN D) 125 MCG (5000 UT) CAPS, Take 1 daily., Disp: 90 capsule, Rfl: 3   Objective:     BP 120/72 (BP Location: Left Arm, Patient Position: Sitting, Cuff Size: Large)   Pulse 76   Temp 98.3 F (36.8 C) (Temporal)   Ht 5\' 9"  (1.753 m)   Wt 263 lb 12.8 oz (119.7 kg)   SpO2 97%   BMI 38.96 kg/m  Wt Readings from Last 3 Encounters:  08/01/22  263 lb 12.8 oz (119.7 kg)  07/30/21 263 lb 9.6 oz (119.6 kg)  01/15/21 251 lb 3.2 oz (113.9 kg)      Physical Exam Constitutional:      General: He is not in acute distress.    Appearance: Normal appearance. He is not ill-appearing, toxic-appearing or diaphoretic.  HENT:     Head: Normocephalic and atraumatic.     Right Ear: Tympanic membrane, ear canal and external ear normal.     Left Ear: Tympanic membrane, ear canal and external ear normal.     Ears:     Comments: There is scaling adhered to the right TM.  Hearing is stable in this ear.    Mouth/Throat:     Mouth: Mucous membranes are moist.     Pharynx: Oropharynx is clear. No oropharyngeal exudate or posterior oropharyngeal erythema.  Eyes:     General: No scleral icterus.       Right eye: No discharge.        Left eye: No discharge.     Extraocular Movements: Extraocular movements intact.     Conjunctiva/sclera: Conjunctivae normal.     Pupils: Pupils are equal, round, and reactive to light.  Cardiovascular:     Rate and Rhythm: Normal rate and regular rhythm.  Pulmonary:     Effort: Pulmonary effort is normal. No respiratory distress.     Breath sounds:  Normal breath sounds.  Abdominal:     General: Bowel sounds are normal.     Tenderness: There is no abdominal tenderness. There is no guarding or rebound.  Musculoskeletal:     Cervical back: No rigidity or tenderness.  Skin:    General: Skin is warm and dry.  Neurological:     Mental Status: He is alert and oriented to person, place, and time.  Psychiatric:        Mood and Affect: Mood normal.        Behavior: Behavior normal.      Results for orders placed or performed in visit on 08/01/22  B12 and Folate Panel  Result Value Ref Range   Vitamin B-12 378 211 - 911 pg/mL   Folate >23.5 >5.9 ng/mL  CBC  Result Value Ref Range   WBC 5.5 4.0 - 10.5 K/uL   RBC 5.37 4.22 - 5.81 Mil/uL   Platelets 234.0 150.0 - 400.0 K/uL   Hemoglobin 15.5 13.0 - 17.0 g/dL    HCT 16.1 09.6 - 04.5 %   MCV 87.7 78.0 - 100.0 fl   MCHC 33.0 30.0 - 36.0 g/dL   RDW 40.9 81.1 - 91.4 %  Comprehensive metabolic panel  Result Value Ref Range   Sodium 138 135 - 145 mEq/L   Potassium 4.4 3.5 - 5.1 mEq/L   Chloride 102 96 - 112 mEq/L   CO2 28 19 - 32 mEq/L   Glucose, Bld 127 (H) 70 - 99 mg/dL   BUN 13 6 - 23 mg/dL   Creatinine, Ser 7.82 0.40 - 1.50 mg/dL   Total Bilirubin 0.6 0.2 - 1.2 mg/dL   Alkaline Phosphatase 96 39 - 117 U/L   AST 22 0 - 37 U/L   ALT 26 0 - 53 U/L   Total Protein 7.3 6.0 - 8.3 g/dL   Albumin 4.3 3.5 - 5.2 g/dL   GFR 956.21 >30.86 mL/min   Calcium 9.3 8.4 - 10.5 mg/dL  Hemoglobin V7Q  Result Value Ref Range   Hgb A1c MFr Bld 6.5 4.6 - 6.5 %  Lipid panel  Result Value Ref Range   Cholesterol 159 0 - 200 mg/dL   Triglycerides 469.6 0.0 - 149.0 mg/dL   HDL 29.52 >84.13 mg/dL   VLDL 24.4 0.0 - 01.0 mg/dL   LDL Cholesterol 88 0 - 99 mg/dL   Total CHOL/HDL Ratio 3    NonHDL 110.56   VITAMIN D 25 Hydroxy (Vit-D Deficiency, Fractures)  Result Value Ref Range   VITD 44.11 30.00 - 100.00 ng/mL  Iron, TIBC and Ferritin Panel  Result Value Ref Range   Iron 135 50 - 180 mcg/dL   TIBC 272 536 - 644 mcg/dL (calc)   %SAT 37 20 - 48 % (calc)   Ferritin 49 38 - 380 ng/mL  Urinalysis, Routine w reflex microscopic  Result Value Ref Range   Color, Urine YELLOW Yellow;Lt. Yellow;Straw;Dark Yellow;Amber;Green;Red;Brown   APPearance CLEAR Clear;Turbid;Slightly Cloudy;Cloudy   Specific Gravity, Urine 1.025 1.000 - 1.030   pH 6.0 5.0 - 8.0   Total Protein, Urine NEGATIVE Negative   Urine Glucose NEGATIVE Negative   Ketones, ur NEGATIVE Negative   Bilirubin Urine NEGATIVE Negative   Hgb urine dipstick NEGATIVE Negative   Urobilinogen, UA 0.2 0.0 - 1.0   Leukocytes,Ua NEGATIVE Negative   Nitrite NEGATIVE Negative   WBC, UA 0-2/hpf 0-2/hpf   RBC / HPF none seen 0-2/hpf   Squamous Epithelial / HPF Rare(0-4/hpf) Rare(0-4/hpf)  Microalbumin /  creatinine  urine ratio  Result Value Ref Range   Microalb, Ur 5.3 (H) 0.0 - 1.9 mg/dL   Creatinine,U 161.0 mg/dL   Microalb Creat Ratio 2.7 0.0 - 30.0 mg/g  Uric acid  Result Value Ref Range   Uric Acid, Serum 5.1 4.0 - 7.8 mg/dL  EXTRA LAV TOP TUBE  Result Value Ref Range   EXTRA LAVENDER-TOP TUBE    Extra Specimen  Result Value Ref Range   Extra tube recieved     Specimen type recieved Frozen Plasma       The 10-year ASCVD risk score (Arnett DK, et al., 2019) is: 1.6%    Assessment & Plan:   Pre-diabetes -     Comprehensive metabolic panel -     Hemoglobin A1c -     Microalbumin / creatinine urine ratio -     metFORMIN HCl ER; Take 1 tablet (500 mg total) by mouth daily with breakfast.  Dispense: 90 tablet; Refill: 0  Gout, unspecified cause, unspecified chronicity, unspecified site -     Allopurinol; TAKE 1 TABLET(300 MG) BY MOUTH DAILY  Dispense: 90 tablet; Refill: 3 -     Comprehensive metabolic panel -     Uric acid  Healthcare maintenance -     Comprehensive metabolic panel -     Lipid panel -     Urinalysis, Routine w reflex microscopic  History of bariatric surgery -     Vitamin D; Take 1 daily.  Dispense: 90 capsule; Refill: 3 -     B12 and Folate Panel -     CBC -     VITAMIN D 25 Hydroxy (Vit-D Deficiency, Fractures) -     Iron, TIBC and Ferritin Panel -     Vitamin B1 -     Zinc -     Ceruloplasmin  Other orders -     EXTRA LAV TOP TUBE -     Extra Specimen    Return in about 6 months (around 01/31/2023).  Strongly encouraged regular dental care.  Continue exercise program.  Encouraged him to continue weight loss journey.  Discussed restarting metformin pending hemoglobin A1c.  Checking deficiencies associated with history of bariatric surgery.  Information given on health maintenance and disease prevention.  Formation was given on prediabetes.  Mliss Sax, MD

## 2022-08-02 ENCOUNTER — Encounter: Payer: Self-pay | Admitting: Family Medicine

## 2022-08-02 ENCOUNTER — Ambulatory Visit: Payer: Federal, State, Local not specified - PPO | Admitting: Family Medicine

## 2022-08-02 VITALS — BP 124/80 | HR 74 | Temp 97.2°F | Resp 16 | Ht 69.0 in | Wt 265.1 lb

## 2022-08-02 DIAGNOSIS — R7303 Prediabetes: Secondary | ICD-10-CM | POA: Diagnosis not present

## 2022-08-02 DIAGNOSIS — H15102 Unspecified episcleritis, left eye: Secondary | ICD-10-CM

## 2022-08-02 DIAGNOSIS — H15002 Unspecified scleritis, left eye: Secondary | ICD-10-CM

## 2022-08-02 DIAGNOSIS — H2000 Unspecified acute and subacute iridocyclitis: Secondary | ICD-10-CM | POA: Diagnosis not present

## 2022-08-02 MED ORDER — METFORMIN HCL ER 500 MG PO TB24
500.0000 mg | ORAL_TABLET | Freq: Every day | ORAL | 0 refills | Status: DC
Start: 2022-08-02 — End: 2022-11-03

## 2022-08-02 NOTE — Progress Notes (Signed)
Established Patient Office Visit   Subjective:  Patient ID: Wayne Castillo, male    DOB: Sep 25, 1980  Age: 42 y.o. MRN: [id number]  Chief Complaint  Patient presents with   Eye Pain    Pt states his left eye is red , matted , watery  Pain and pressure in eye     Eye Pain  Associated symptoms include eye redness and photophobia. Pertinent negatives include no blurred vision, eye discharge, double vision, tingling or weakness.   Encounter Diagnoses  Name Primary?   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  Eyes headache with fever.  Watery discharge.  No AM matting.  No injury.  No recent URI symptoms.  Vision has been unaffected.   Review of Systems  Constitutional: Negative.   HENT: Negative.    Eyes:  Positive for photophobia, pain and redness. Negative for blurred vision, double vision and discharge.  Respiratory: Negative.    Cardiovascular: Negative.   Gastrointestinal:  Negative for abdominal pain.  Genitourinary: Negative.   Musculoskeletal: Negative.  Negative for myalgias.  Skin:  Negative for rash.  Neurological:  Negative for tingling, loss of consciousness, weakness and headaches.  Endo/Heme/Allergies:  Negative for polydipsia.     Current Outpatient Medications:    allopurinol (ZYLOPRIM) 300 MG tablet, TAKE 1 TABLET(300 MG) BY MOUTH DAILY, Disp: 90 tablet, Rfl: 3   Cholecalciferol (VITAMIN D) 125 MCG (5000 UT) CAPS, Take 1 daily., Disp: 90 capsule, Rfl: 3   metFORMIN (GLUCOPHAGE-XR) 500 MG 24 hr tablet, Take 1 tablet (500 mg total) by mouth daily with breakfast. (Patient not taking: Reported on 08/02/2022), Disp: 90 tablet, Rfl: 0   Objective:     BP 124/80   Pulse 74   Temp (!) 97.2 F (36.2 C) (Temporal)   Resp 16   Ht 5\' 9"  (1.753 m)   Wt 265 lb 2 oz (120.3 kg)   SpO2 98%   BMI 39.15 kg/m    Physical Exam Constitutional:      General: He is not in acute  distress.    Appearance: Normal appearance. He is not ill-appearing, toxic-appearing or diaphoretic.  HENT:     Head: Normocephalic and atraumatic.     Right Ear: External ear normal.     Left Ear: External ear normal.  Eyes:     General: No scleral icterus.       Right eye: No discharge.        Left eye: No discharge.     Extraocular Movements: Extraocular movements intact.     Conjunctiva/sclera:     Left eye: Left conjunctiva is injected. No chemosis, exudate or hemorrhage.    Pupils: Pupils are equal, round, and reactive to light.     Comments: Orbits are soft palpation.  No contralateral pain in OS with light shined in OD.  Minimal pain in OS would like to OS.   Pulmonary:     Effort: Pulmonary effort is normal. No respiratory distress.  Skin:    General: Skin is warm and dry.  Neurological:     Mental Status: He is alert and oriented to person, place, and time.  Psychiatric:        Mood and Affect: Mood normal.        Behavior: Behavior normal.      No results found for any visits on 08/02/22.    The 10-year  ASCVD risk score (Arnett DK, et al., 2019) is: 1.7%    Assessment & Plan:   Scleritis and episcleritis of left eye -     Ambulatory referral to Ophthalmology  Pre-diabetes    Return in about 3 months (around 11/01/2022), or if symptoms worsen or fail to improve.  A1c has increased.  Will restart Glucophage Exar 500 mg daily.  Urgent referral to ophthalmology.   Mliss Sax, MD

## 2022-08-02 NOTE — Addendum Note (Signed)
Addended by: Andrez Grime on: 08/02/2022 10:34 AM   Modules accepted: Orders

## 2022-08-07 LAB — VITAMIN B1

## 2022-08-07 LAB — ZINC

## 2022-08-07 LAB — IRON,TIBC AND FERRITIN PANEL
%SAT: 37 % (calc) (ref 20–48)
Ferritin: 49 ng/mL (ref 38–380)
Iron: 135 ug/dL (ref 50–180)
TIBC: 362 mcg/dL (calc) (ref 250–425)

## 2022-08-07 LAB — EXTRA SPECIMEN

## 2022-08-07 LAB — CERULOPLASMIN: Ceruloplasmin: 29 mg/dL (ref 18–36)

## 2022-08-07 LAB — EXTRA LAV TOP TUBE

## 2022-08-13 DIAGNOSIS — H2000 Unspecified acute and subacute iridocyclitis: Secondary | ICD-10-CM | POA: Diagnosis not present

## 2022-09-10 DIAGNOSIS — H2000 Unspecified acute and subacute iridocyclitis: Secondary | ICD-10-CM | POA: Diagnosis not present

## 2022-10-17 ENCOUNTER — Other Ambulatory Visit: Payer: Self-pay | Admitting: Family Medicine

## 2022-10-17 ENCOUNTER — Telehealth: Payer: Self-pay | Admitting: Family Medicine

## 2022-10-17 DIAGNOSIS — M109 Gout, unspecified: Secondary | ICD-10-CM

## 2022-10-17 NOTE — Telephone Encounter (Signed)
Pt stated that his pharmacy has told him that the refill for allopurinol (ZYLOPRIM) 300 MG tablet [[id number]] has been denied by the provider. I saw it still had refills. Can you get this straightened out for him?

## 2022-10-17 NOTE — Telephone Encounter (Signed)
He did and was insistent that there was none because that's what the pharmacy said. I told him he has 3 but refused to believe me.

## 2022-11-03 ENCOUNTER — Other Ambulatory Visit: Payer: Self-pay | Admitting: Family Medicine

## 2022-11-03 DIAGNOSIS — R7303 Prediabetes: Secondary | ICD-10-CM

## 2023-01-14 ENCOUNTER — Other Ambulatory Visit: Payer: Self-pay | Admitting: Family Medicine

## 2023-01-14 DIAGNOSIS — R7303 Prediabetes: Secondary | ICD-10-CM

## 2023-03-27 ENCOUNTER — Encounter (HOSPITAL_COMMUNITY): Payer: Self-pay | Admitting: *Deleted

## 2023-07-08 ENCOUNTER — Other Ambulatory Visit: Payer: Self-pay | Admitting: Family Medicine

## 2023-07-08 DIAGNOSIS — R7303 Prediabetes: Secondary | ICD-10-CM

## 2023-08-04 ENCOUNTER — Encounter: Payer: Federal, State, Local not specified - PPO | Admitting: Family Medicine

## 2023-08-05 ENCOUNTER — Encounter: Payer: Federal, State, Local not specified - PPO | Admitting: Family Medicine

## 2023-08-14 ENCOUNTER — Ambulatory Visit: Admitting: Family Medicine

## 2023-08-14 ENCOUNTER — Encounter: Payer: Self-pay | Admitting: Family Medicine

## 2023-08-14 VITALS — BP 128/84 | HR 67 | Temp 97.2°F | Ht 69.0 in | Wt 279.4 lb

## 2023-08-14 DIAGNOSIS — M109 Gout, unspecified: Secondary | ICD-10-CM

## 2023-08-14 DIAGNOSIS — E1165 Type 2 diabetes mellitus with hyperglycemia: Secondary | ICD-10-CM | POA: Diagnosis not present

## 2023-08-14 DIAGNOSIS — Z9884 Bariatric surgery status: Secondary | ICD-10-CM

## 2023-08-14 DIAGNOSIS — Z6841 Body Mass Index (BMI) 40.0 and over, adult: Secondary | ICD-10-CM

## 2023-08-14 DIAGNOSIS — Z7984 Long term (current) use of oral hypoglycemic drugs: Secondary | ICD-10-CM

## 2023-08-14 DIAGNOSIS — Z13220 Encounter for screening for lipoid disorders: Secondary | ICD-10-CM

## 2023-08-14 DIAGNOSIS — Z0000 Encounter for general adult medical examination without abnormal findings: Secondary | ICD-10-CM | POA: Diagnosis not present

## 2023-08-14 DIAGNOSIS — R7303 Prediabetes: Secondary | ICD-10-CM

## 2023-08-14 LAB — LIPID PANEL
Cholesterol: 187 mg/dL (ref 0–200)
HDL: 43.3 mg/dL (ref >39.00)
LDL Cholesterol: 114 mg/dL — ABNORMAL HIGH (ref 0–99)
NonHDL: 143.55
Total CHOL/HDL Ratio: 4
Triglycerides: 146 mg/dL (ref 0.0–149.0)
VLDL: 29.2 mg/dL (ref 0.0–40.0)

## 2023-08-14 LAB — CBC WITH DIFFERENTIAL/PLATELET
Basophils Absolute: 0 10*3/uL (ref 0.0–0.1)
Basophils Relative: 0.5 % (ref 0.0–3.0)
Eosinophils Absolute: 0.1 10*3/uL (ref 0.0–0.7)
Eosinophils Relative: 2 % (ref 0.0–5.0)
HCT: 45.4 % (ref 39.0–52.0)
Hemoglobin: 15.4 g/dL (ref 13.0–17.0)
Lymphocytes Relative: 32.5 % (ref 12.0–46.0)
Lymphs Abs: 1.6 10*3/uL (ref 0.7–4.0)
MCHC: 33.8 g/dL (ref 30.0–36.0)
MCV: 88.3 fl (ref 78.0–100.0)
Monocytes Absolute: 0.4 10*3/uL (ref 0.1–1.0)
Monocytes Relative: 8.3 % (ref 3.0–12.0)
Neutro Abs: 2.8 10*3/uL (ref 1.4–7.7)
Neutrophils Relative %: 56.7 % (ref 43.0–77.0)
Platelets: 257 10*3/uL (ref 150.0–400.0)
RBC: 5.14 Mil/uL (ref 4.22–5.81)
RDW: 13.1 % (ref 11.5–15.5)
WBC: 5 10*3/uL (ref 4.0–10.5)

## 2023-08-14 LAB — URINALYSIS, ROUTINE W REFLEX MICROSCOPIC
Bilirubin Urine: NEGATIVE
Hgb urine dipstick: NEGATIVE
Ketones, ur: NEGATIVE
Leukocytes,Ua: NEGATIVE
Nitrite: NEGATIVE
RBC / HPF: NONE SEEN (ref 0–?)
Specific Gravity, Urine: 1.025 (ref 1.000–1.030)
Total Protein, Urine: 30 — AB
Urine Glucose: NEGATIVE
Urobilinogen, UA: 0.2 (ref 0.0–1.0)
pH: 6 (ref 5.0–8.0)

## 2023-08-14 LAB — VITAMIN B12: Vitamin B-12: 401 pg/mL (ref 211–911)

## 2023-08-14 LAB — URIC ACID: Uric Acid, Serum: 5.8 mg/dL (ref 4.0–7.8)

## 2023-08-14 LAB — COMPREHENSIVE METABOLIC PANEL WITH GFR
ALT: 37 U/L (ref 0–53)
AST: 26 U/L (ref 0–37)
Albumin: 4.5 g/dL (ref 3.5–5.2)
Alkaline Phosphatase: 103 U/L (ref 39–117)
BUN: 13 mg/dL (ref 6–23)
CO2: 28 meq/L (ref 19–32)
Calcium: 9.5 mg/dL (ref 8.4–10.5)
Chloride: 101 meq/L (ref 96–112)
Creatinine, Ser: 0.9 mg/dL (ref 0.40–1.50)
GFR: 104.51 mL/min (ref >60.00)
Glucose, Bld: 149 mg/dL — ABNORMAL HIGH (ref 70–99)
Potassium: 4.2 meq/L (ref 3.5–5.1)
Sodium: 138 meq/L (ref 135–145)
Total Bilirubin: 0.6 mg/dL (ref 0.2–1.2)
Total Protein: 7.3 g/dL (ref 6.0–8.3)

## 2023-08-14 LAB — MICROALBUMIN / CREATININE URINE RATIO
Creatinine,U: 222.3 mg/dL
Microalb Creat Ratio: 55.7 mg/g — ABNORMAL HIGH (ref 0.0–30.0)
Microalb, Ur: 12.4 mg/dL — ABNORMAL HIGH (ref 0.0–1.9)

## 2023-08-14 LAB — HEMOGLOBIN A1C: Hgb A1c MFr Bld: 8.1 % — ABNORMAL HIGH (ref 4.6–6.5)

## 2023-08-14 LAB — VITAMIN D 25 HYDROXY (VIT D DEFICIENCY, FRACTURES): VITD: 44.41 ng/mL (ref 30.00–100.00)

## 2023-08-14 MED ORDER — METFORMIN HCL ER 500 MG PO TB24
500.0000 mg | ORAL_TABLET | Freq: Two times a day (BID) | ORAL | 1 refills | Status: DC
Start: 2023-08-14 — End: 2024-02-12

## 2023-08-14 NOTE — Addendum Note (Signed)
 Addended by: Delene Feinstein on: 08/14/2023 03:32 PM   Modules accepted: Orders

## 2023-08-14 NOTE — Progress Notes (Addendum)
 Established Patient Office Visit   Subjective:  Patient ID: Wayne Castillo, male    DOB: 1980-08-14  Age: 43 y.o. MRN: [id number]  Chief Complaint  Patient presents with   Annual Exam    Cpe. Pt is fasting. Refused Prevnar    HPI Encounter Diagnoses  Name Primary?   Healthcare maintenance Yes   Pre-diabetes    Gout, unspecified cause, unspecified chronicity, unspecified site    History of bariatric surgery    Morbid obesity with BMI of 40.0-44.9, adult (HCC)    Screening for cholesterol level    Type 2 diabetes mellitus with hyperglycemia, without long-term current use of insulin  (HCC)    For follow-up of above.  Continues allopurinol , vitamin D  and metformin .  He exercises by going to the gym and walking.  No regular dental care at this time.  He lives on his own in an apartment.  History of gastric sleeve bariatric surgery.   Review of Systems  Constitutional: Negative.   HENT: Negative.    Eyes:  Negative for blurred vision, discharge and redness.  Respiratory: Negative.    Cardiovascular: Negative.   Gastrointestinal:  Negative for abdominal pain.  Genitourinary: Negative.   Musculoskeletal: Negative.  Negative for myalgias.  Skin:  Negative for rash.  Neurological:  Negative for tingling, loss of consciousness and weakness.  Endo/Heme/Allergies:  Negative for polydipsia.      08/14/2023   11:05 AM 08/01/2022    9:00 AM 08/01/2022    8:27 AM  Depression screen PHQ 2/9  Decreased Interest 0 0 0  Down, Depressed, Hopeless 0 0 0  PHQ - 2 Score 0 0 0  Altered sleeping 0 0   Tired, decreased energy 0 1   Change in appetite 0 0   Feeling bad or failure about yourself  0 0   Trouble concentrating 0 0   Moving slowly or fidgety/restless 0 0   Suicidal thoughts 0 0   PHQ-9 Score 0 1   Difficult doing work/chores Not difficult at all Not difficult at all       Current Outpatient Medications:    allopurinol  (ZYLOPRIM ) 300 MG tablet, TAKE 1 TABLET(300 MG) BY MOUTH  DAILY, Disp: 90 tablet, Rfl: 3   Cholecalciferol (VITAMIN D ) 125 MCG (5000 UT) CAPS, Take 1 daily., Disp: 90 capsule, Rfl: 3   metFORMIN  (GLUCOPHAGE -XR) 500 MG 24 hr tablet, Take 1 tablet (500 mg total) by mouth 2 (two) times daily with a meal., Disp: 180 tablet, Rfl: 1   Objective:     BP 128/84 (BP Location: Right Arm, Patient Position: Sitting, Cuff Size: Large)   Pulse 67   Temp (!) 97.2 F (36.2 C) (Temporal)   Ht 5\' 9"  (1.753 m)   Wt 279 lb 6.4 oz (126.7 kg)   SpO2 97%   BMI 41.26 kg/m  Wt Readings from Last 3 Encounters:  08/14/23 279 lb 6.4 oz (126.7 kg)  08/02/22 265 lb 2 oz (120.3 kg)  08/01/22 263 lb 12.8 oz (119.7 kg)      Physical Exam Constitutional:      General: He is not in acute distress.    Appearance: Normal appearance. He is obese. He is not ill-appearing, toxic-appearing or diaphoretic.  HENT:     Head: Normocephalic and atraumatic.     Right Ear: External ear normal.     Left Ear: External ear normal.     Mouth/Throat:     Mouth: Mucous membranes are moist.  Pharynx: Oropharynx is clear. No oropharyngeal exudate or posterior oropharyngeal erythema.  Eyes:     General: No scleral icterus.       Right eye: No discharge.        Left eye: No discharge.     Extraocular Movements: Extraocular movements intact.     Conjunctiva/sclera: Conjunctivae normal.     Pupils: Pupils are equal, round, and reactive to light.  Cardiovascular:     Rate and Rhythm: Normal rate and regular rhythm.  Pulmonary:     Effort: Pulmonary effort is normal. No respiratory distress.     Breath sounds: Normal breath sounds. No wheezing or rhonchi.  Abdominal:     General: Bowel sounds are normal.     Tenderness: There is no abdominal tenderness. There is no guarding or rebound.     Hernia: There is no hernia in the left inguinal area or right inguinal area.  Genitourinary:    Penis: Circumcised. No hypospadias, erythema, tenderness, discharge, swelling or lesions.       Testes:        Right: Mass, tenderness or swelling not present. Right testis is descended.        Left: Mass, tenderness or swelling not present. Left testis is descended.     Epididymis:     Right: Not inflamed or enlarged. No mass.     Left: Not inflamed or enlarged. No mass.  Musculoskeletal:     Cervical back: No rigidity or tenderness.  Lymphadenopathy:     Lower Body: No right inguinal adenopathy. No left inguinal adenopathy.  Skin:    General: Skin is warm and dry.  Neurological:     Mental Status: He is alert and oriented to person, place, and time.  Psychiatric:        Mood and Affect: Mood normal.        Behavior: Behavior normal.      Results for orders placed or performed in visit on 08/14/23  CBC with Differential/Platelet  Result Value Ref Range   WBC 5.0 4.0 - 10.5 K/uL   RBC 5.14 4.22 - 5.81 Mil/uL   Hemoglobin 15.4 13.0 - 17.0 g/dL   HCT 16.1 09.6 - 04.5 %   MCV 88.3 78.0 - 100.0 fl   MCHC 33.8 30.0 - 36.0 g/dL   RDW 40.9 81.1 - 91.4 %   Platelets 257.0 150.0 - 400.0 K/uL   Neutrophils Relative % 56.7 43.0 - 77.0 %   Lymphocytes Relative 32.5 12.0 - 46.0 %   Monocytes Relative 8.3 3.0 - 12.0 %   Eosinophils Relative 2.0 0.0 - 5.0 %   Basophils Relative 0.5 0.0 - 3.0 %   Neutro Abs 2.8 1.4 - 7.7 K/uL   Lymphs Abs 1.6 0.7 - 4.0 K/uL   Monocytes Absolute 0.4 0.1 - 1.0 K/uL   Eosinophils Absolute 0.1 0.0 - 0.7 K/uL   Basophils Absolute 0.0 0.0 - 0.1 K/uL  Comprehensive metabolic panel with GFR  Result Value Ref Range   Sodium 138 135 - 145 mEq/L   Potassium 4.2 3.5 - 5.1 mEq/L   Chloride 101 96 - 112 mEq/L   CO2 28 19 - 32 mEq/L   Glucose, Bld 149 (H) 70 - 99 mg/dL   BUN 13 6 - 23 mg/dL   Creatinine, Ser 7.82 0.40 - 1.50 mg/dL   Total Bilirubin 0.6 0.2 - 1.2 mg/dL   Alkaline Phosphatase 103 39 - 117 U/L   AST 26 0 - 37 U/L  ALT 37 0 - 53 U/L   Total Protein 7.3 6.0 - 8.3 g/dL   Albumin 4.5 3.5 - 5.2 g/dL   GFR 161.09 >60.45 mL/min   Calcium 9.5  8.4 - 10.5 mg/dL  Lipid panel  Result Value Ref Range   Cholesterol 187 0 - 200 mg/dL   Triglycerides 409.8 0.0 - 149.0 mg/dL   HDL 11.91 >47.82 mg/dL   VLDL 95.6 0.0 - 21.3 mg/dL   LDL Cholesterol 086 (H) 0 - 99 mg/dL   Total CHOL/HDL Ratio 4    NonHDL 143.55   Hemoglobin A1c  Result Value Ref Range   Hgb A1c MFr Bld 8.1 (H) 4.6 - 6.5 %  Microalbumin / creatinine urine ratio  Result Value Ref Range   Microalb, Ur 12.4 (H) 0.0 - 1.9 mg/dL   Creatinine,U 578.4 mg/dL   Microalb Creat Ratio 55.7 (H) 0.0 - 30.0 mg/g  Urinalysis, Routine w reflex microscopic  Result Value Ref Range   Color, Urine YELLOW Yellow;Lt. Yellow;Straw;Dark Yellow;Amber;Green;Red;Brown   APPearance Sl Cloudy (A) Clear;Turbid;Slightly Cloudy;Cloudy   Specific Gravity, Urine 1.025 1.000 - 1.030   pH 6.0 5.0 - 8.0   Total Protein, Urine 30 (A) Negative   Urine Glucose NEGATIVE Negative   Ketones, ur NEGATIVE Negative   Bilirubin Urine NEGATIVE Negative   Hgb urine dipstick NEGATIVE Negative   Urobilinogen, UA 0.2 0.0 - 1.0   Leukocytes,Ua NEGATIVE Negative   Nitrite NEGATIVE Negative   WBC, UA 0-2/hpf 0-2/hpf   RBC / HPF none seen 0-2/hpf   Mucus, UA Presence of (A) None   Hyaline Casts, UA Presence of (A) None  Uric acid  Result Value Ref Range   Uric Acid, Serum 5.8 4.0 - 7.8 mg/dL  VITAMIN D  25 Hydroxy (Vit-D Deficiency, Fractures)  Result Value Ref Range   VITD 44.41 30.00 - 100.00 ng/mL  Vitamin B12  Result Value Ref Range   Vitamin B-12 401 211 - 911 pg/mL      The 10-year ASCVD risk score (Arnett DK, et al., 2019) is: 3.3%    Assessment & Plan:   Healthcare maintenance -     CBC with Differential/Platelet  Pre-diabetes -     Comprehensive metabolic panel with GFR -     Hemoglobin A1c -     Microalbumin / creatinine urine ratio  Gout, unspecified cause, unspecified chronicity, unspecified site -     Comprehensive metabolic panel with GFR -     Uric acid  History of bariatric  surgery -     VITAMIN D  25 Hydroxy (Vit-D Deficiency, Fractures) -     Vitamin B12  Morbid obesity with BMI of 40.0-44.9, adult (HCC)  Screening for cholesterol level -     Comprehensive metabolic panel with GFR -     Lipid panel -     Urinalysis, Routine w reflex microscopic  Type 2 diabetes mellitus with hyperglycemia, without long-term current use of insulin  (HCC) -     metFORMIN  HCl ER; Take 1 tablet (500 mg total) by mouth 2 (two) times daily with a meal.  Dispense: 180 tablet; Refill: 1    Return in about 3 months (around 11/14/2023).  Recommended nutritional consultation and/or referral to medical weight loss management.  Patient is just not motivated at this time and declines referrals.  Information was given on exercising to lose weight.  Recommended 300 minutes of exercise weekly for exercise goals.  Expressed my concern of increasing weight.  He is not interested in a  GLP-1 agonist pends might not be a good candidate with his lack of motivation to lose weight.  Information was given on health maintenance and disease prevention.  I recommended nutrition counseling and he said that he was not sure if he would even follow their recommendations.  Again recommended regular dental care.  Tonna Frederic, MD   5/8 addendum: Diabetes is now under poor control.  Increase metformin  to 500 mg twice daily.  Again urged weight loss and exercise.

## 2023-10-04 ENCOUNTER — Other Ambulatory Visit: Payer: Self-pay | Admitting: Family Medicine

## 2023-10-04 DIAGNOSIS — M109 Gout, unspecified: Secondary | ICD-10-CM

## 2023-11-07 ENCOUNTER — Ambulatory Visit: Admitting: Family Medicine

## 2023-11-07 VITALS — BP 138/84 | HR 70 | Temp 98.5°F | Ht 69.0 in | Wt 271.8 lb

## 2023-11-07 DIAGNOSIS — R030 Elevated blood-pressure reading, without diagnosis of hypertension: Secondary | ICD-10-CM | POA: Diagnosis not present

## 2023-11-07 DIAGNOSIS — R809 Proteinuria, unspecified: Secondary | ICD-10-CM

## 2023-11-07 DIAGNOSIS — E7800 Pure hypercholesterolemia, unspecified: Secondary | ICD-10-CM

## 2023-11-07 DIAGNOSIS — E1165 Type 2 diabetes mellitus with hyperglycemia: Secondary | ICD-10-CM | POA: Diagnosis not present

## 2023-11-07 LAB — COMPREHENSIVE METABOLIC PANEL WITH GFR
ALT: 30 U/L (ref 0–53)
AST: 23 U/L (ref 0–37)
Albumin: 4.4 g/dL (ref 3.5–5.2)
Alkaline Phosphatase: 104 U/L (ref 39–117)
BUN: 12 mg/dL (ref 6–23)
CO2: 29 meq/L (ref 19–32)
Calcium: 9.9 mg/dL (ref 8.4–10.5)
Chloride: 101 meq/L (ref 96–112)
Creatinine, Ser: 0.86 mg/dL (ref 0.40–1.50)
GFR: 105.78 mL/min (ref >60.00)
Glucose, Bld: 150 mg/dL — ABNORMAL HIGH (ref 70–99)
Potassium: 4.1 meq/L (ref 3.5–5.1)
Sodium: 138 meq/L (ref 135–145)
Total Bilirubin: 0.6 mg/dL (ref 0.2–1.2)
Total Protein: 7.7 g/dL (ref 6.0–8.3)

## 2023-11-07 LAB — MICROALBUMIN / CREATININE URINE RATIO
Creatinine,U: 219.7 mg/dL
Microalb Creat Ratio: 41.5 mg/g — ABNORMAL HIGH (ref 0.0–30.0)
Microalb, Ur: 9.1 mg/dL — ABNORMAL HIGH (ref 0.0–1.9)

## 2023-11-07 LAB — HEMOGLOBIN A1C: Hgb A1c MFr Bld: 7.5 % — ABNORMAL HIGH (ref 4.6–6.5)

## 2023-11-07 MED ORDER — LISINOPRIL 10 MG PO TABS
10.0000 mg | ORAL_TABLET | Freq: Every day | ORAL | 3 refills | Status: AC
Start: 2023-11-07 — End: ?

## 2023-11-07 NOTE — Progress Notes (Signed)
 Established Patient Office Visit   Subjective:  Patient ID: Wayne Castillo, male    DOB: April 08, 1981  Age: 43 y.o. MRN: [id number]  Chief Complaint  Patient presents with   Medical Management of Chronic Issues    3 month follow up. Pt is fasting. Has increased Metformin . Needs foot and eye exam. No concerns or questions.     HPI Encounter Diagnoses  Name Primary?   Type 2 diabetes mellitus with hyperglycemia, without long-term current use of insulin  (HCC) Yes   Elevated LDL cholesterol level    Proteinuria, unspecified type    Elevated BP without diagnosis of hypertension    For follow-up of above.  He has been able to lose some weight.  He has been drinking more water.  He continues to exercise regularly.   Review of Systems  Constitutional: Negative.   HENT: Negative.    Eyes:  Negative for blurred vision, discharge and redness.  Respiratory: Negative.    Cardiovascular: Negative.   Gastrointestinal:  Negative for abdominal pain.  Genitourinary: Negative.   Musculoskeletal: Negative.  Negative for myalgias.  Skin:  Negative for rash.  Neurological:  Negative for tingling, loss of consciousness and weakness.  Endo/Heme/Allergies:  Negative for polydipsia.     Current Outpatient Medications:    allopurinol  (ZYLOPRIM ) 300 MG tablet, TAKE 1 TABLET(300 MG) BY MOUTH DAILY, Disp: 90 tablet, Rfl: 3   Cholecalciferol (VITAMIN D ) 125 MCG (5000 UT) CAPS, Take 1 daily., Disp: 90 capsule, Rfl: 3   lisinopril (ZESTRIL) 10 MG tablet, Take 1 tablet (10 mg total) by mouth daily., Disp: 90 tablet, Rfl: 3   metFORMIN  (GLUCOPHAGE -XR) 500 MG 24 hr tablet, Take 1 tablet (500 mg total) by mouth 2 (two) times daily with a meal., Disp: 180 tablet, Rfl: 1   Multiple Vitamin (MULTIVITAMIN) tablet, Take 1 tablet by mouth daily., Disp: , Rfl:    Objective:     BP 138/84 (BP Location: Right Arm, Patient Position: Sitting, Cuff Size: Large)   Pulse 70   Temp 98.5 F (36.9 C) (Temporal)   Ht 5'  9 (1.753 m)   Wt 271 lb 12.8 oz (123.3 kg)   SpO2 95%   BMI 40.14 kg/m  BP Readings from Last 3 Encounters:  11/07/23 138/84  08/14/23 128/84  08/02/22 124/80   Wt Readings from Last 3 Encounters:  11/07/23 271 lb 12.8 oz (123.3 kg)  08/14/23 279 lb 6.4 oz (126.7 kg)  08/02/22 265 lb 2 oz (120.3 kg)      Physical Exam Constitutional:      General: He is not in acute distress.    Appearance: Normal appearance. He is not ill-appearing, toxic-appearing or diaphoretic.  HENT:     Head: Normocephalic and atraumatic.     Right Ear: External ear normal.     Left Ear: External ear normal.  Eyes:     General: No scleral icterus.       Right eye: No discharge.        Left eye: No discharge.     Extraocular Movements: Extraocular movements intact.     Conjunctiva/sclera: Conjunctivae normal.  Cardiovascular:     Pulses:          Dorsalis pedis pulses are 2+ on the right side and 2+ on the left side.       Posterior tibial pulses are 2+ on the right side and 2+ on the left side.  Pulmonary:     Effort: Pulmonary effort is normal. No  respiratory distress.  Skin:    General: Skin is warm and dry.  Neurological:     Mental Status: He is alert and oriented to person, place, and time.  Psychiatric:        Mood and Affect: Mood normal.        Behavior: Behavior normal.    Diabetic Foot Exam - Simple   Simple Foot Form Diabetic Foot exam was performed with the following findings: Yes 11/07/2023  8:50 AM  Visual Inspection See comments: Yes Sensation Testing Intact to touch and monofilament testing bilaterally: Yes Pulse Check Posterior Tibialis and Dorsalis pulse intact bilaterally: Yes Comments Feet are pes planus bilaterally.  There are no lesions or ulcerations.       No results found for any visits on 11/07/23.    The 10-year ASCVD risk score (Arnett DK, et al., 2019) is: 4.4%    Assessment & Plan:   Type 2 diabetes mellitus with hyperglycemia, without long-term  current use of insulin  (HCC) -     Hemoglobin A1c -     Comprehensive metabolic panel with GFR -     Microalbumin / creatinine urine ratio -     Lisinopril; Take 1 tablet (10 mg total) by mouth daily.  Dispense: 90 tablet; Refill: 3  Elevated LDL cholesterol level  Proteinuria, unspecified type -     Comprehensive metabolic panel with GFR -     Microalbumin / creatinine urine ratio -     Lisinopril; Take 1 tablet (10 mg total) by mouth daily.  Dispense: 90 tablet; Refill: 3  Elevated BP without diagnosis of hypertension -     Comprehensive metabolic panel with GFR -     Microalbumin / creatinine urine ratio -     Lisinopril; Take 1 tablet (10 mg total) by mouth daily.  Dispense: 90 tablet; Refill: 3    Return Follow-up pends results of today's labs or in 3 to 6 months., for chronic disease follow-up.  Encouraged continued exercise and weight loss efforts.  Will start lisinopril 10 mg daily.  Information was given on managing hypertension.  Information was given on preventing high cholesterol.  Information was given on proteinuria.  Again stressed the importance of weight loss.  He will do his best.  Elsie Sim Lent, MD

## 2023-11-10 ENCOUNTER — Ambulatory Visit: Payer: Self-pay | Admitting: Family Medicine

## 2023-11-10 NOTE — Addendum Note (Signed)
 Addended by: BERNETA ELSIE LABOR on: 11/10/2023 09:03 AM   Modules accepted: Orders

## 2023-12-12 DIAGNOSIS — N189 Chronic kidney disease, unspecified: Secondary | ICD-10-CM | POA: Diagnosis not present

## 2023-12-12 DIAGNOSIS — E1122 Type 2 diabetes mellitus with diabetic chronic kidney disease: Secondary | ICD-10-CM | POA: Diagnosis not present

## 2023-12-12 DIAGNOSIS — R030 Elevated blood-pressure reading, without diagnosis of hypertension: Secondary | ICD-10-CM | POA: Diagnosis not present

## 2023-12-12 DIAGNOSIS — R809 Proteinuria, unspecified: Secondary | ICD-10-CM | POA: Diagnosis not present

## 2024-02-12 ENCOUNTER — Other Ambulatory Visit: Payer: Self-pay | Admitting: Family Medicine

## 2024-02-12 DIAGNOSIS — E1165 Type 2 diabetes mellitus with hyperglycemia: Secondary | ICD-10-CM

## 2024-02-13 ENCOUNTER — Ambulatory Visit: Admitting: Family Medicine

## 2024-02-13 ENCOUNTER — Encounter: Payer: Self-pay | Admitting: Family Medicine

## 2024-02-13 ENCOUNTER — Ambulatory Visit: Payer: Self-pay | Admitting: Family Medicine

## 2024-02-13 VITALS — BP 118/74 | HR 71 | Temp 98.2°F | Ht 69.0 in | Wt 265.0 lb

## 2024-02-13 DIAGNOSIS — E1165 Type 2 diabetes mellitus with hyperglycemia: Secondary | ICD-10-CM

## 2024-02-13 DIAGNOSIS — R809 Proteinuria, unspecified: Secondary | ICD-10-CM

## 2024-02-13 DIAGNOSIS — E7800 Pure hypercholesterolemia, unspecified: Secondary | ICD-10-CM

## 2024-02-13 DIAGNOSIS — R030 Elevated blood-pressure reading, without diagnosis of hypertension: Secondary | ICD-10-CM

## 2024-02-13 LAB — BASIC METABOLIC PANEL WITH GFR
BUN: 16 mg/dL (ref 6–23)
CO2: 28 meq/L (ref 19–32)
Calcium: 9.5 mg/dL (ref 8.4–10.5)
Chloride: 99 meq/L (ref 96–112)
Creatinine, Ser: 0.87 mg/dL (ref 0.40–1.50)
GFR: 105.22 mL/min (ref >60.00)
Glucose, Bld: 131 mg/dL — ABNORMAL HIGH (ref 70–99)
Potassium: 4.1 meq/L (ref 3.5–5.1)
Sodium: 136 meq/L (ref 135–145)

## 2024-02-13 LAB — HEMOGLOBIN A1C: Hgb A1c MFr Bld: 7.3 % — ABNORMAL HIGH (ref 4.6–6.5)

## 2024-02-13 MED ORDER — ATORVASTATIN CALCIUM 10 MG PO TABS: 10.0000 mg | ORAL_TABLET | ORAL | 3 refills | Status: AC

## 2024-02-13 NOTE — Progress Notes (Signed)
 Established Patient Office Visit   Subjective:  Patient ID: Wayne Castillo, male    DOB: [birth year]  Age: 44 y.o. MRN: [id number]  No chief complaint on file.   HPI Encounter Diagnoses  Name Primary?   Type 2 diabetes mellitus with hyperglycemia, without long-term current use of insulin  (HCC) Yes   Elevated LDL cholesterol level    Elevated BP without diagnosis of hypertension    Proteinuria, unspecified type    For follow-up, status post increasing microalbumin creatinine ratio.  He did see the nephrologist.  He has been able to lose 14 pounds.  He continues exercising in gym work.   Review of Systems  Constitutional: Negative.   HENT: Negative.    Eyes:  Negative for blurred vision, discharge and redness.  Respiratory: Negative.    Cardiovascular: Negative.   Gastrointestinal:  Negative for abdominal pain.  Genitourinary: Negative.   Musculoskeletal: Negative.  Negative for myalgias.  Skin:  Negative for rash.  Neurological:  Negative for tingling, loss of consciousness and weakness.  Endo/Heme/Allergies:  Negative for polydipsia.      02/13/2024    8:39 AM 02/13/2024    8:31 AM 11/07/2023    8:22 AM  Depression screen PHQ 2/9  Decreased Interest  0 0  Down, Depressed, Hopeless  0 0  PHQ - 2 Score  0 0  Altered sleeping 0    Tired, decreased energy 0    Change in appetite 0    Feeling bad or failure about yourself  0    Trouble concentrating 0    Moving slowly or fidgety/restless 0    Suicidal thoughts 0    Difficult doing work/chores Not difficult at all         Current Outpatient Medications:    atorvastatin (LIPITOR) 10 MG tablet, Take 1 tablet (10 mg total) by mouth every other day., Disp: 45 tablet, Rfl: 3   allopurinol  (ZYLOPRIM ) 300 MG tablet, TAKE 1 TABLET(300 MG) BY MOUTH DAILY, Disp: 90 tablet, Rfl: 3   Cholecalciferol (VITAMIN D ) 125 MCG (5000 UT) CAPS, Take 1 daily., Disp: 90 capsule, Rfl: 3   lisinopril  (ZESTRIL ) 10 MG tablet, Take 1 tablet (10 mg  total) by mouth daily., Disp: 90 tablet, Rfl: 3   metFORMIN  (GLUCOPHAGE -XR) 500 MG 24 hr tablet, TAKE 1 TABLET(500 MG) BY MOUTH TWICE DAILY WITH A MEAL, Disp: 180 tablet, Rfl: 1   Multiple Vitamin (MULTIVITAMIN) tablet, Take 1 tablet by mouth daily., Disp: , Rfl:    Objective:     BP 118/74 (BP Location: Right Arm, Patient Position: Sitting, Cuff Size: Large)   Pulse 71   Temp 98.2 F (36.8 C) (Temporal)   Ht 5' 9 (1.753 m)   Wt 265 lb (120.2 kg)   SpO2 96%   BMI 39.13 kg/m  BP Readings from Last 3 Encounters:  02/13/24 118/74  11/07/23 138/84  08/14/23 128/84   Wt Readings from Last 3 Encounters:  02/13/24 265 lb (120.2 kg)  11/07/23 271 lb 12.8 oz (123.3 kg)  08/14/23 279 lb 6.4 oz (126.7 kg)      Physical Exam Constitutional:      General: He is not in acute distress.    Appearance: Normal appearance. He is not ill-appearing, toxic-appearing or diaphoretic.  HENT:     Head: Normocephalic and atraumatic.     Right Ear: External ear normal.     Left Ear: External ear normal.  Eyes:     General: No scleral icterus.  Right eye: No discharge.        Left eye: No discharge.     Extraocular Movements: Extraocular movements intact.     Conjunctiva/sclera: Conjunctivae normal.  Pulmonary:     Effort: Pulmonary effort is normal. No respiratory distress.  Skin:    General: Skin is warm and dry.  Neurological:     Mental Status: He is alert and oriented to person, place, and time.  Psychiatric:        Mood and Affect: Mood normal.        Behavior: Behavior normal.      No results found for any visits on 02/13/24.    The 10-year ASCVD risk score (Arnett DK, et al., 2019) is: 3.8%    Assessment & Plan:   Type 2 diabetes mellitus with hyperglycemia, without long-term current use of insulin  (HCC) -     Basic metabolic panel with GFR -     Hemoglobin A1c  Elevated LDL cholesterol level -     Atorvastatin Calcium; Take 1 tablet (10 mg total) by mouth every  other day.  Dispense: 45 tablet; Refill: 3  Elevated BP without diagnosis of hypertension -     Basic metabolic panel with GFR  Proteinuria, unspecified type -     Basic metabolic panel with GFR    Return in about 6 months (around 08/12/2024), or Continue weight loss efforts with exercise.SABRA Soho to start a low-dose statin every other day.  To lower his risk for vascular disease.  Discussed GLP-1 agonist.  He declines for now.  Elsie Sim Lent, MD

## 2024-04-12 ENCOUNTER — Encounter (HOSPITAL_COMMUNITY): Payer: Self-pay | Admitting: *Deleted

## 2024-08-12 ENCOUNTER — Ambulatory Visit: Admitting: Family Medicine
